# Patient Record
Sex: Male | Born: 1965 | Race: White | Hispanic: No | Smoking: Former smoker
Health system: Southern US, Community
[De-identification: ages and names within clinical notes are randomized; demographics above are authoritative.]

## PROBLEM LIST (undated history)

## (undated) DIAGNOSIS — I509 Heart failure, unspecified: Secondary | ICD-10-CM

## (undated) DIAGNOSIS — F319 Bipolar disorder, unspecified: Secondary | ICD-10-CM

## (undated) DIAGNOSIS — E119 Type 2 diabetes mellitus without complications: Secondary | ICD-10-CM

## (undated) DIAGNOSIS — I10 Essential (primary) hypertension: Secondary | ICD-10-CM

---

## 2008-02-15 ENCOUNTER — Ambulatory Visit: Payer: Self-pay | Admitting: Diagnostic Radiology

## 2008-02-15 ENCOUNTER — Emergency Department (HOSPITAL_BASED_OUTPATIENT_CLINIC_OR_DEPARTMENT_OTHER): Admission: EM | Admit: 2008-02-15 | Discharge: 2008-02-15 | Payer: Self-pay | Admitting: Emergency Medicine

## 2010-12-17 LAB — POCT CARDIAC MARKERS: CKMB, poc: <1 ng/mL — ABNORMAL LOW (ref 1.0–8.0)

## 2010-12-17 LAB — COMPREHENSIVE METABOLIC PANEL
BUN: 15 mg/dL (ref 6–23)
CO2: 28 mEq/L (ref 19–32)
Calcium: 10.4 mg/dL (ref 8.4–10.5)
Chloride: 100 mEq/L (ref 96–112)
Creatinine, Ser: 1 mg/dL (ref 0.4–1.5)
GFR calc non Af Amer: >60 mL/min (ref >60)
Total Bilirubin: 0.5 mg/dL (ref 0.3–1.2)

## 2010-12-17 LAB — PROTIME-INR
INR: 1 (ref 0.00–1.49)
Prothrombin Time: 13 seconds (ref 11.6–15.2)

## 2010-12-17 LAB — DIFFERENTIAL
Basophils Absolute: 0.1 10*3/uL (ref 0.0–0.1)
Eosinophils Relative: 2 % (ref 0–5)
Lymphocytes Relative: 35 % (ref 12–46)
Monocytes Relative: 8 % (ref 3–12)
Neutrophils Relative %: 54 % (ref 43–77)

## 2010-12-17 LAB — CBC
HCT: 37.8 % — ABNORMAL LOW (ref 39.0–52.0)
MCHC: 31.1 g/dL (ref 30.0–36.0)
MCV: 68.8 fL — ABNORMAL LOW (ref 78.0–100.0)
RBC: 5.5 MIL/uL (ref 4.22–5.81)
WBC: 8.2 10*3/uL (ref 4.0–10.5)

## 2010-12-17 LAB — LIPASE, BLOOD: Lipase: 47 U/L (ref 23–300)

## 2014-12-11 ENCOUNTER — Emergency Department (HOSPITAL_BASED_OUTPATIENT_CLINIC_OR_DEPARTMENT_OTHER): Payer: Medicaid Other

## 2014-12-11 ENCOUNTER — Encounter (HOSPITAL_BASED_OUTPATIENT_CLINIC_OR_DEPARTMENT_OTHER): Payer: Self-pay | Admitting: Emergency Medicine

## 2014-12-11 ENCOUNTER — Inpatient Hospital Stay (HOSPITAL_BASED_OUTPATIENT_CLINIC_OR_DEPARTMENT_OTHER)
Admission: EM | Admit: 2014-12-11 | Discharge: 2014-12-31 | DRG: 871 | Disposition: A | Discharge status: Hospice | Payer: Medicaid Other | Attending: Internal Medicine | Admitting: Internal Medicine

## 2014-12-11 DIAGNOSIS — F319 Bipolar disorder, unspecified: Secondary | ICD-10-CM | POA: Diagnosis present

## 2014-12-11 DIAGNOSIS — Z79899 Other long term (current) drug therapy: Secondary | ICD-10-CM

## 2014-12-11 DIAGNOSIS — Z7982 Long term (current) use of aspirin: Secondary | ICD-10-CM

## 2014-12-11 DIAGNOSIS — Z9114 Patient's other noncompliance with medication regimen: Secondary | ICD-10-CM

## 2014-12-11 DIAGNOSIS — E871 Hypo-osmolality and hyponatremia: Secondary | ICD-10-CM | POA: Diagnosis present

## 2014-12-11 DIAGNOSIS — Z794 Long term (current) use of insulin: Secondary | ICD-10-CM

## 2014-12-11 DIAGNOSIS — L03314 Cellulitis of groin: Secondary | ICD-10-CM | POA: Diagnosis present

## 2014-12-11 DIAGNOSIS — T368X5A Adverse effect of other systemic antibiotics, initial encounter: Secondary | ICD-10-CM | POA: Diagnosis not present

## 2014-12-11 DIAGNOSIS — Z6841 Body Mass Index (BMI) 40.0 and over, adult: Secondary | ICD-10-CM

## 2014-12-11 DIAGNOSIS — R4182 Altered mental status, unspecified: Secondary | ICD-10-CM

## 2014-12-11 DIAGNOSIS — I272 Other secondary pulmonary hypertension: Secondary | ICD-10-CM | POA: Diagnosis present

## 2014-12-11 DIAGNOSIS — L03116 Cellulitis of left lower limb: Secondary | ICD-10-CM | POA: Diagnosis present

## 2014-12-11 DIAGNOSIS — Z515 Encounter for palliative care: Secondary | ICD-10-CM

## 2014-12-11 DIAGNOSIS — E0781 Sick-euthyroid syndrome: Secondary | ICD-10-CM | POA: Diagnosis present

## 2014-12-11 DIAGNOSIS — N183 Chronic kidney disease, stage 3 unspecified: Secondary | ICD-10-CM | POA: Diagnosis present

## 2014-12-11 DIAGNOSIS — E860 Dehydration: Secondary | ICD-10-CM | POA: Diagnosis present

## 2014-12-11 DIAGNOSIS — N5089 Other specified disorders of the male genital organs: Secondary | ICD-10-CM

## 2014-12-11 DIAGNOSIS — A419 Sepsis, unspecified organism: Principal | ICD-10-CM | POA: Diagnosis present

## 2014-12-11 DIAGNOSIS — I361 Nonrheumatic tricuspid (valve) insufficiency: Secondary | ICD-10-CM | POA: Diagnosis present

## 2014-12-11 DIAGNOSIS — I426 Alcoholic cardiomyopathy: Secondary | ICD-10-CM | POA: Diagnosis present

## 2014-12-11 DIAGNOSIS — I739 Peripheral vascular disease, unspecified: Secondary | ICD-10-CM | POA: Diagnosis present

## 2014-12-11 DIAGNOSIS — L97929 Non-pressure chronic ulcer of unspecified part of left lower leg with unspecified severity: Secondary | ICD-10-CM | POA: Diagnosis present

## 2014-12-11 DIAGNOSIS — K219 Gastro-esophageal reflux disease without esophagitis: Secondary | ICD-10-CM | POA: Diagnosis present

## 2014-12-11 DIAGNOSIS — L0390 Cellulitis, unspecified: Secondary | ICD-10-CM | POA: Diagnosis present

## 2014-12-11 DIAGNOSIS — I130 Hypertensive heart and chronic kidney disease with heart failure and stage 1 through stage 4 chronic kidney disease, or unspecified chronic kidney disease: Secondary | ICD-10-CM | POA: Diagnosis present

## 2014-12-11 DIAGNOSIS — J9601 Acute respiratory failure with hypoxia: Secondary | ICD-10-CM | POA: Diagnosis not present

## 2014-12-11 DIAGNOSIS — R21 Rash and other nonspecific skin eruption: Secondary | ICD-10-CM | POA: Diagnosis not present

## 2014-12-11 DIAGNOSIS — F419 Anxiety disorder, unspecified: Secondary | ICD-10-CM | POA: Diagnosis present

## 2014-12-11 DIAGNOSIS — I5023 Acute on chronic systolic (congestive) heart failure: Secondary | ICD-10-CM | POA: Diagnosis present

## 2014-12-11 DIAGNOSIS — L97919 Non-pressure chronic ulcer of unspecified part of right lower leg with unspecified severity: Secondary | ICD-10-CM | POA: Diagnosis present

## 2014-12-11 DIAGNOSIS — I959 Hypotension, unspecified: Secondary | ICD-10-CM | POA: Diagnosis present

## 2014-12-11 DIAGNOSIS — Z9119 Patient's noncompliance with other medical treatment and regimen: Secondary | ICD-10-CM

## 2014-12-11 DIAGNOSIS — R601 Generalized edema: Secondary | ICD-10-CM | POA: Diagnosis present

## 2014-12-11 DIAGNOSIS — R451 Restlessness and agitation: Secondary | ICD-10-CM | POA: Diagnosis not present

## 2014-12-11 DIAGNOSIS — L03115 Cellulitis of right lower limb: Secondary | ICD-10-CM | POA: Diagnosis present

## 2014-12-11 DIAGNOSIS — G4733 Obstructive sleep apnea (adult) (pediatric): Secondary | ICD-10-CM | POA: Diagnosis present

## 2014-12-11 DIAGNOSIS — N179 Acute kidney failure, unspecified: Secondary | ICD-10-CM | POA: Diagnosis present

## 2014-12-11 DIAGNOSIS — R0602 Shortness of breath: Secondary | ICD-10-CM

## 2014-12-11 DIAGNOSIS — G9340 Encephalopathy, unspecified: Secondary | ICD-10-CM | POA: Diagnosis not present

## 2014-12-11 DIAGNOSIS — IMO0001 Reserved for inherently not codable concepts without codable children: Secondary | ICD-10-CM | POA: Insufficient documentation

## 2014-12-11 DIAGNOSIS — E876 Hypokalemia: Secondary | ICD-10-CM | POA: Diagnosis present

## 2014-12-11 DIAGNOSIS — B369 Superficial mycosis, unspecified: Secondary | ICD-10-CM | POA: Diagnosis present

## 2014-12-11 DIAGNOSIS — Z66 Do not resuscitate: Secondary | ICD-10-CM | POA: Diagnosis not present

## 2014-12-11 DIAGNOSIS — Z590 Homelessness: Secondary | ICD-10-CM

## 2014-12-11 DIAGNOSIS — N492 Inflammatory disorders of scrotum: Secondary | ICD-10-CM | POA: Diagnosis present

## 2014-12-11 DIAGNOSIS — Z880 Allergy status to penicillin: Secondary | ICD-10-CM

## 2014-12-11 DIAGNOSIS — E1122 Type 2 diabetes mellitus with diabetic chronic kidney disease: Secondary | ICD-10-CM | POA: Diagnosis present

## 2014-12-11 DIAGNOSIS — Z87891 Personal history of nicotine dependence: Secondary | ICD-10-CM

## 2014-12-11 DIAGNOSIS — L03119 Cellulitis of unspecified part of limb: Secondary | ICD-10-CM

## 2014-12-11 DIAGNOSIS — I340 Nonrheumatic mitral (valve) insufficiency: Secondary | ICD-10-CM | POA: Diagnosis present

## 2014-12-11 HISTORY — DX: Type 2 diabetes mellitus without complications: E11.9

## 2014-12-11 HISTORY — DX: Bipolar disorder, unspecified: F31.9

## 2014-12-11 HISTORY — DX: Essential (primary) hypertension: I10

## 2014-12-11 HISTORY — DX: Heart failure, unspecified: I50.9

## 2014-12-11 LAB — COMPREHENSIVE METABOLIC PANEL
ALK PHOS: 205 U/L — AB (ref 38–126)
ALT: 14 U/L — ABNORMAL LOW (ref 17–63)
ANION GAP: 14 (ref 5–15)
AST: 35 U/L (ref 15–41)
Albumin: 2.2 g/dL — ABNORMAL LOW (ref 3.5–5.0)
BILIRUBIN TOTAL: 4.3 mg/dL — AB (ref 0.3–1.2)
BUN: 35 mg/dL — ABNORMAL HIGH (ref 6–20)
CALCIUM: 8.5 mg/dL — AB (ref 8.9–10.3)
CO2: 26 mmol/L (ref 22–32)
Chloride: 89 mmol/L — ABNORMAL LOW (ref 101–111)
Creatinine, Ser: 2.32 mg/dL — ABNORMAL HIGH (ref 0.61–1.24)
GFR calc non Af Amer: 31 mL/min — ABNORMAL LOW (ref >60)
GFR, EST AFRICAN AMERICAN: 36 mL/min — AB (ref >60)
Glucose, Bld: 193 mg/dL — ABNORMAL HIGH (ref 65–99)
POTASSIUM: 3.5 mmol/L (ref 3.5–5.1)
SODIUM: 129 mmol/L — AB (ref 135–145)
TOTAL PROTEIN: 7 g/dL (ref 6.5–8.1)

## 2014-12-11 LAB — I-STAT CG4 LACTIC ACID, ED
LACTIC ACID, VENOUS: 3.62 mmol/L — AB (ref 0.5–2.0)
Lactic Acid, Venous: 3.83 mmol/L (ref 0.5–2.0)

## 2014-12-11 LAB — CBC WITH DIFFERENTIAL/PLATELET
Basophils Absolute: 0 10*3/uL (ref 0.0–0.1)
Basophils Relative: 0 %
Eosinophils Absolute: 0 10*3/uL (ref 0.0–0.7)
Eosinophils Relative: 0 %
HCT: 35.5 % — ABNORMAL LOW (ref 39.0–52.0)
Hemoglobin: 10.3 g/dL — ABNORMAL LOW (ref 13.0–17.0)
Lymphocytes Relative: 7 %
Lymphs Abs: 0.9 10*3/uL (ref 0.7–4.0)
MCH: 18.6 pg — ABNORMAL LOW (ref 26.0–34.0)
MCHC: 29 g/dL — ABNORMAL LOW (ref 30.0–36.0)
MCV: 64.1 fL — ABNORMAL LOW (ref 78.0–100.0)
Monocytes Absolute: 0.5 10*3/uL (ref 0.1–1.0)
Monocytes Relative: 4 %
Neutro Abs: 11.1 10*3/uL — ABNORMAL HIGH (ref 1.7–7.7)
Neutrophils Relative %: 89 %
Platelets: 396 10*3/uL (ref 150–400)
RBC: 5.54 MIL/uL (ref 4.22–5.81)
RDW: 22 % — ABNORMAL HIGH (ref 11.5–15.5)
WBC: 12.5 10*3/uL — ABNORMAL HIGH (ref 4.0–10.5)

## 2014-12-11 LAB — TROPONIN I: Troponin I: 0.04 ng/mL — ABNORMAL HIGH (ref <0.031)

## 2014-12-11 LAB — BRAIN NATRIURETIC PEPTIDE: B Natriuretic Peptide: 1897.6 pg/mL — ABNORMAL HIGH (ref 0.0–100.0)

## 2014-12-11 MED ORDER — SODIUM CHLORIDE 0.9 % IV BOLUS (SEPSIS)
500.0000 mL | Freq: Once | INTRAVENOUS | Status: AC
  Administered 2014-12-11: 500 mL via INTRAVENOUS

## 2014-12-11 MED ORDER — SODIUM CHLORIDE 0.9 % IV BOLUS (SEPSIS)
1000.0000 mL | Freq: Once | INTRAVENOUS | Status: AC
  Administered 2014-12-11: 1000 mL via INTRAVENOUS

## 2014-12-11 MED ORDER — FUROSEMIDE 10 MG/ML IJ SOLN
80.0000 mg | Freq: Once | INTRAMUSCULAR | Status: AC
  Administered 2014-12-11: 80 mg via INTRAVENOUS
  Filled 2014-12-11: qty 8

## 2014-12-11 MED ORDER — VANCOMYCIN HCL IN DEXTROSE 1-5 GM/200ML-% IV SOLN
1000.0000 mg | Freq: Once | INTRAVENOUS | Status: AC
  Administered 2014-12-11: 1000 mg via INTRAVENOUS
  Filled 2014-12-11: qty 200

## 2014-12-11 MED ORDER — CIPROFLOXACIN IN D5W 400 MG/200ML IV SOLN
400.0000 mg | Freq: Once | INTRAVENOUS | Status: AC
  Administered 2014-12-11: 400 mg via INTRAVENOUS
  Filled 2014-12-11: qty 200

## 2014-12-11 MED ORDER — MORPHINE SULFATE (PF) 4 MG/ML IV SOLN
4.0000 mg | Freq: Once | INTRAVENOUS | Status: AC
  Administered 2014-12-11: 4 mg via INTRAVENOUS

## 2014-12-11 MED ORDER — MORPHINE SULFATE (PF) 4 MG/ML IV SOLN
4.0000 mg | Freq: Once | INTRAVENOUS | Status: AC
  Administered 2014-12-11: 4 mg via INTRAVENOUS
  Filled 2014-12-11: qty 1

## 2014-12-11 MED ORDER — CLINDAMYCIN PHOSPHATE 600 MG/50ML IV SOLN
600.0000 mg | Freq: Once | INTRAVENOUS | Status: AC
  Administered 2014-12-11: 600 mg via INTRAVENOUS
  Filled 2014-12-11: qty 50

## 2014-12-11 MED ORDER — MORPHINE SULFATE (PF) 4 MG/ML IV SOLN
INTRAVENOUS | Status: AC
Start: 2014-12-11 — End: 2014-12-12
  Filled 2014-12-11: qty 1

## 2014-12-11 MED ORDER — DIPHENHYDRAMINE HCL 50 MG/ML IJ SOLN
25.0000 mg | Freq: Once | INTRAMUSCULAR | Status: AC
  Administered 2014-12-11: 25 mg via INTRAVENOUS
  Filled 2014-12-11: qty 1

## 2014-12-11 NOTE — ED Provider Notes (Signed)
CSN: [id number]     Arrival date & time 12/11/14  1747 History  By signing my name below, I, Gwenyth Ober, attest that this documentation has been prepared under the direction and in the presence of Rolan Bucco, MD  Electronically Signed: Gwenyth Ober, ED Scribe. 12/11/2014. 7:09 PM.   Chief Complaint  Patient presents with  . Chest Pain   The history is provided by the patient and a relative. No language interpreter was used.    HPI Comments: Tom Reynolds is a 49 y.o. male accompanied by family, with a history of substance abuse, DM, HTN, CHF and renal failure, who presents to the Emergency Department complaining of constant, moderate SOB that started 2 weeks ago and became worse today. Pt states increased bilateral leg swelling, myalgias, subjective fever, productive cough with clear sputum and genital pain and swelling as associated symptoms. He also notes CP that started today and becomes worse with deep breathing. Pt was seen in the ED at [facility] 2 days ago and left AMA . He was hospitalized at [facility] for CHF 2 weeks ago and was discharged 1 week ago. 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 He takes Lasix-120 mg daily and has been compliant with his medication. Pt denies abdominal pain.  He was also seen at a clinic in [facility] this morning where he also left AMA according to their notes.  On review of his last hospital admission earlier this month, he had an echo showing an EF of 20-25%  No PCP  Past Medical History  Diagnosis Date  . Diabetes mellitus without complication   . Hypertension   . CHF (congestive heart failure)    History reviewed. No pertinent past surgical history. History reviewed. No pertinent family history. Social History  Substance Use Topics  . Smoking status: Former Games developer  . Smokeless tobacco: None  . Alcohol Use: No   Review of Systems  Constitutional: Positive for fever (subjective). Negative for chills,  diaphoresis and fatigue.  HENT: Negative for congestion, rhinorrhea and sneezing.   Eyes: Negative.   Respiratory: Positive for cough and shortness of breath. Negative for chest tightness.   Cardiovascular: Positive for leg swelling. Negative for chest pain.  Gastrointestinal: Negative for nausea, vomiting, abdominal pain, diarrhea and blood in stool.  Genitourinary: Positive for penile swelling and scrotal swelling. Negative for frequency, hematuria, flank pain and difficulty urinating.  Musculoskeletal: Positive for myalgias. Negative for back pain and arthralgias.  Skin: Negative for rash.  Neurological: Negative for dizziness, speech difficulty, weakness, numbness and headaches.  All other systems reviewed and are negative.  Allergies  Penicillins  Home Medications   Prior to Admission medications   Medication Sig Start Date End Date Taking? Authorizing Provider  aspirin 81 MG tablet Take 81 mg by mouth daily.   Yes Historical Provider, MD  omeprazole (PRILOSEC) 40 MG capsule Take 40 mg by mouth daily.   Yes Historical Provider, MD   BP 104/82 mmHg  Pulse 116  Temp(Src) 97.7 F (36.5 C) (Oral)  Resp 22  Ht  (1.702 m)  Wt 276 lb 3 oz (125.278 kg)  BMI 43.25 kg/m2  SpO2 100% Physical Exam  Constitutional: He is oriented to person, place, and time. He appears well-developed and well-nourished.  Obese  HENT:  Head: Normocephalic and atraumatic.  Eyes: Pupils are equal, round, and reactive to light.  Neck: Normal range of motion. Neck supple.  Cardiovascular: Normal rate,  regular rhythm and normal heart sounds.   Pulmonary/Chest: Effort normal and breath sounds normal. No respiratory distress. He has no wheezes. He has no rales. He exhibits no tenderness.  Abdominal: Soft. Bowel sounds are normal. There is no tenderness. There is no rebound and no guarding.  Genitourinary:  Patient has diffuse swelling to his scrotum with erythema around his scrotum and lower abdomen.  There is a large area of the anterior portion of the scrotum he has some skin sloughing/exudative material  Musculoskeletal: Normal range of motion. He exhibits edema.  Diffuse edema to both lower extremities with erythema throughout his lower extremities. He has weeping of fluid from his legs. He also has several draining ulcerated areas to his right lower leg  Lymphadenopathy:    He has no cervical adenopathy.  Neurological: He is alert and oriented to person, place, and time.  Skin: Skin is warm and dry. No rash noted.  Psychiatric: He has a normal mood and affect.        ED Course  Procedures   DIAGNOSTIC STUDIES: Oxygen Saturation is 100% on RA, normal by my interpretation.    COORDINATION OF CARE: 6:49 PM Discussed treatment plan which includes a chest x-ray and lab work with pt and family at bedside. They agreed to plan.  Labs Review Labs Reviewed  COMPREHENSIVE METABOLIC PANEL - Abnormal; Notable for the following:    Sodium 129 (*)    Chloride 89 (*)    Glucose, Bld 193 (*)    BUN 35 (*)    Creatinine, Ser 2.32 (*)    Calcium 8.5 (*)    Albumin 2.2 (*)    ALT 14 (*)    Alkaline Phosphatase 205 (*)    Total Bilirubin 4.3 (*)    GFR calc non Af Amer 31 (*)    GFR calc Af Amer 36 (*)    All other components within normal limits  CBC WITH DIFFERENTIAL/PLATELET - Abnormal; Notable for the following:    WBC 12.5 (*)    Hemoglobin 10.3 (*)    HCT 35.5 (*)    MCV 64.1 (*)    MCH 18.6 (*)    MCHC 29.0 (*)    RDW 22.0 (*)    Neutro Abs 11.1 (*)    All other components within normal limits  TROPONIN I - Abnormal; Notable for the following:    Troponin I 0.04 (*)    All other components within normal limits  BRAIN NATRIURETIC PEPTIDE - Abnormal; Notable for the following:    B Natriuretic Peptide 1897.6 (*)    All other components within normal limits  I-STAT CG4 LACTIC ACID, ED - Abnormal; Notable for the following:    Lactic Acid, Venous 3.83 (*)    All other  components within normal limits  I-STAT CG4 LACTIC ACID, ED - Abnormal; Notable for the following:    Lactic Acid, Venous 3.62 (*)    All other components within normal limits  CULTURE, BLOOD (ROUTINE X 2)  CULTURE, BLOOD (ROUTINE X 2)  URINE CULTURE  URINALYSIS, ROUTINE W REFLEX MICROSCOPIC (NOT AT [facility])  I-STAT CG4 LACTIC ACID, ED    Imaging Review Dg Chest 2 View  12/11/2014   CLINICAL DATA:  Shortness of breath and scrotal swelling  EXAM: CHEST  2 VIEW  COMPARISON:  December 09, 2014  FINDINGS: The heart size is enlarged. The mediastinal contour is normal. There is no focal infiltrate, pulmonary edema, or pleural effusion. Degenerative joint changes of the spine  are noted.  IMPRESSION: No active cardiopulmonary disease.  Cardiomegaly.   Electronically Signed   By: Sherian Rein M.D.   On: 12/11/2014 21:33   US Scrotum  12/11/2014   CLINICAL DATA:  Scrotal swelling for several weeks  EXAM: SCROTAL ULTRASOUND  DOPPLER ULTRASOUND OF THE TESTICLES  TECHNIQUE: Complete ultrasound examination of the testicles, epididymis, and other scrotal structures was performed. Color and spectral Doppler ultrasound were also utilized to evaluate blood flow to the testicles.  COMPARISON:  11/26/2014  FINDINGS: Right testicle  Measurements: 41 x 28 x 29 mm. No mass or microlithiasis visualized.  Left testicle  Measurements: 38 x 28 x 28 mm. No mass or microlithiasis visualized.  Right epididymis: Not able to be visualized. No indication of enlargement or hypervascularity.  Left epididymis: Not able to be visualized. No indication of enlargement or hypervascularity.  Hydrocele:  None visualized.  Varicocele:  None visualized.  Pulsed Doppler interrogation of both testes demonstrates normal low resistance arterial waveforms bilaterally. Detection and venous waveforms was more difficult, likely hindered by skin thickening.  Improved but still marked thickening of the scrotal wall diffusely, without evidence of  collection or gas.  IMPRESSION: 1. Scrotal wall thickening, improved from 11/26/2014 but still marked. No fluid collection. 2. Negative testicles.   Electronically Signed   By: Marnee Spring M.D.   On: 12/11/2014 22:00   [place] Art/ven Flow Abd Pelv Doppler  12/11/2014   CLINICAL DATA:  Scrotal swelling for several weeks  EXAM: SCROTAL ULTRASOUND  DOPPLER ULTRASOUND OF THE TESTICLES  TECHNIQUE: Complete ultrasound examination of the testicles, epididymis, and other scrotal structures was performed. Color and spectral Doppler ultrasound were also utilized to evaluate blood flow to the testicles.  COMPARISON:  11/26/2014  FINDINGS: Right testicle  Measurements: 41 x 28 x 29 mm. No mass or microlithiasis visualized.  Left testicle  Measurements: 38 x 28 x 28 mm. No mass or microlithiasis visualized.  Right epididymis: Not able to be visualized. No indication of enlargement or hypervascularity.  Left epididymis: Not able to be visualized. No indication of enlargement or hypervascularity.  Hydrocele:  None visualized.  Varicocele:  None visualized.  Pulsed Doppler interrogation of both testes demonstrates normal low resistance arterial waveforms bilaterally. Detection and venous waveforms was more difficult, likely hindered by skin thickening.  Improved but still marked thickening of the scrotal wall diffusely, without evidence of collection or gas.  IMPRESSION: 1. Scrotal wall thickening, improved from 11/26/2014 but still marked. No fluid collection. 2. Negative testicles.   Electronically Signed   By: Marnee Spring M.D.   On: 12/11/2014 22:00   I have personally reviewed and evaluated these images and lab results as part of my medical decision-making.   EKG Interpretation   Date/Time:  Thursday December 11 2014 18:00:43 EDT Ventricular Rate:  110 PR Interval:  158 QRS Duration: 94 QT Interval:  376 QTC Calculation: 508 R Axis:   28 Text Interpretation:  Sinus tachycardia Possible Left atrial  enlargement  Nonspecific T wave abnormality Abnormal ECG changed from prior EKG, more T  wave flattening Confirmed by BELFI  MD, MELANIE ([id number]) on 12/11/2014  8:30:40 PM     MDM   Final diagnoses:  Cellulitis of lower extremity, unspecified laterality  Anasarca  Sepsis, due to unspecified organism    Patient initially presents with signs of CHF. He also has evidence of possible diffuse cellulitis to his lower extremities and diffuse swelling with questionable cellulitis/Fournier's gangrene to his scrotum. Per his report,  his scrotum has looked like this for about 6-7 days. He does have a penicillin allergy and in consultation with the pharmacists, he was started on vancomycin, clindamycin and Cipro.  20:15 patient does have positive sepsis markers with an elevated WBC count and lactate. He's mildly tachycardic. He has now has not been hypotensive. I did consult again with the pharmacists who feels that the antibiotics we have used our appropriate coverage at this point. His weight-based fluid bolus would be 4 L. I feel that this patient is markedly fluid overloaded currently. I will give fluid more slowly and cautiously.  22:30 no evidence of gas in tissues of scrotum that would be more indicative of Fournier's gangrene.  He also states that the area of skin breakdown has been there for about 6-7 days.   He currently has received about 700 cc of IV fluids and has a second liter ordered to be given over 2 hours. His repeat lactate is similar to his first lactate but slightly improved.  23:45 I spoke with Dr. Allena Katz who requested I speak with urology prior to transfer. I spoke with Dr. Chilton Si to see urology fellow on. He feels that we should go ahead and do a CT scan prior to transfer. If his CT scan does not show evidence of Fournier's gangrene, he can be admitted to [facility] for Dr. Allena Katz and urology will consult on the patient. Dr. Daun Peacock to follow-up on CT scan andwith Dr. Allena Katz following the  CT scan. Hasn't now, patient has had about 1.5 L of fluid. His respirations are okay with no increased work of breathing. Oxygen saturations are normal. He is not hypotensive.  Sepsis - Repeat Assessment  Performed at:    22:00   Vitals     Blood pressure 104/82, pulse 116, temperature 97.7 F (36.5 C), temperature source Oral, resp. rate 22, height  (1.702 m), weight 276 lb 3 oz (125.278 kg), SpO2 100 %.  Heart:     Tachycardic  Lungs:    CTA  Capillary Refill:   <2 sec  Peripheral Pulse:   Radial pulse palpable  Skin:     Normal Color    CRITICAL CARE Performed by: BELFI, MELANIE Total critical care time: 75 Critical care time was exclusive of separately billable procedures and treating other patients. Critical care was necessary to treat or prevent imminent or life-threatening deterioration. Critical care was time spent personally by me on the following activities: development of treatment plan with patient and/or surrogate as well as nursing, discussions with consultants, evaluation of patient's response to treatment, examination of patient, obtaining history from patient or surrogate, ordering and performing treatments and interventions, ordering and review of laboratory studies, ordering and review of radiographic studies, pulse oximetry and re-evaluation of patient's condition.   I personally performed the services described in this documentation, which was scribed in my presence.  The recorded information has been reviewed and considered.     Rolan Bucco, MD 12/12/14 [id number]

## 2014-12-11 NOTE — ED Notes (Signed)
Pt states his scrotum "is huge and I am proud of it". Asks Nurse "do you want to see it". Pt instructed to wait for MD.

## 2014-12-11 NOTE — ED Notes (Signed)
C/o all over generalized pain. Increased sob. Pt very unkept.

## 2014-12-11 NOTE — ED Notes (Signed)
Pt with significant medical history, was at [facility] today and left [facility], pt stated that they overmedicated him and he wanted to go to another hospital

## 2014-12-11 NOTE — ED Notes (Signed)
Called to room pt c/o choking and can't breathe. PT sats 100%. Pt hyperventilating. Pt encouraged to slow breathing down and breathe in his nose and out his mouth. PT asked for emesis basin. Pt spit up mod amt of white secretions into bag. Pt states he feels better. Pt sats remained 100%  thruout.

## 2014-12-12 ENCOUNTER — Emergency Department (HOSPITAL_BASED_OUTPATIENT_CLINIC_OR_DEPARTMENT_OTHER): Payer: Medicaid Other

## 2014-12-12 DIAGNOSIS — L03115 Cellulitis of right lower limb: Secondary | ICD-10-CM | POA: Diagnosis present

## 2014-12-12 DIAGNOSIS — J9601 Acute respiratory failure with hypoxia: Secondary | ICD-10-CM | POA: Diagnosis not present

## 2014-12-12 DIAGNOSIS — R601 Generalized edema: Secondary | ICD-10-CM | POA: Diagnosis present

## 2014-12-12 DIAGNOSIS — L03119 Cellulitis of unspecified part of limb: Secondary | ICD-10-CM | POA: Diagnosis not present

## 2014-12-12 DIAGNOSIS — L97919 Non-pressure chronic ulcer of unspecified part of right lower leg with unspecified severity: Secondary | ICD-10-CM | POA: Diagnosis present

## 2014-12-12 DIAGNOSIS — K219 Gastro-esophageal reflux disease without esophagitis: Secondary | ICD-10-CM | POA: Diagnosis present

## 2014-12-12 DIAGNOSIS — Z79899 Other long term (current) drug therapy: Secondary | ICD-10-CM | POA: Diagnosis not present

## 2014-12-12 DIAGNOSIS — Z9119 Patient's noncompliance with other medical treatment and regimen: Secondary | ICD-10-CM | POA: Diagnosis not present

## 2014-12-12 DIAGNOSIS — N508 Other specified disorders of male genital organs: Secondary | ICD-10-CM | POA: Diagnosis not present

## 2014-12-12 DIAGNOSIS — I959 Hypotension, unspecified: Secondary | ICD-10-CM | POA: Diagnosis present

## 2014-12-12 DIAGNOSIS — Z590 Homelessness: Secondary | ICD-10-CM | POA: Diagnosis not present

## 2014-12-12 DIAGNOSIS — IMO0001 Reserved for inherently not codable concepts without codable children: Secondary | ICD-10-CM | POA: Insufficient documentation

## 2014-12-12 DIAGNOSIS — I739 Peripheral vascular disease, unspecified: Secondary | ICD-10-CM | POA: Diagnosis present

## 2014-12-12 DIAGNOSIS — F3113 Bipolar disorder, current episode manic without psychotic features, severe: Secondary | ICD-10-CM | POA: Diagnosis not present

## 2014-12-12 DIAGNOSIS — R451 Restlessness and agitation: Secondary | ICD-10-CM | POA: Diagnosis not present

## 2014-12-12 DIAGNOSIS — N179 Acute kidney failure, unspecified: Secondary | ICD-10-CM | POA: Diagnosis present

## 2014-12-12 DIAGNOSIS — E871 Hypo-osmolality and hyponatremia: Secondary | ICD-10-CM | POA: Diagnosis present

## 2014-12-12 DIAGNOSIS — E0781 Sick-euthyroid syndrome: Secondary | ICD-10-CM | POA: Diagnosis present

## 2014-12-12 DIAGNOSIS — N183 Chronic kidney disease, stage 3 (moderate): Secondary | ICD-10-CM

## 2014-12-12 DIAGNOSIS — G9340 Encephalopathy, unspecified: Secondary | ICD-10-CM | POA: Diagnosis present

## 2014-12-12 DIAGNOSIS — Z9114 Patient's other noncompliance with medication regimen: Secondary | ICD-10-CM | POA: Diagnosis not present

## 2014-12-12 DIAGNOSIS — F319 Bipolar disorder, unspecified: Secondary | ICD-10-CM | POA: Diagnosis present

## 2014-12-12 DIAGNOSIS — I509 Heart failure, unspecified: Secondary | ICD-10-CM | POA: Diagnosis not present

## 2014-12-12 DIAGNOSIS — I5023 Acute on chronic systolic (congestive) heart failure: Secondary | ICD-10-CM | POA: Diagnosis present

## 2014-12-12 DIAGNOSIS — L0390 Cellulitis, unspecified: Secondary | ICD-10-CM | POA: Diagnosis present

## 2014-12-12 DIAGNOSIS — N5089 Other specified disorders of the male genital organs: Secondary | ICD-10-CM | POA: Diagnosis present

## 2014-12-12 DIAGNOSIS — N492 Inflammatory disorders of scrotum: Secondary | ICD-10-CM | POA: Diagnosis present

## 2014-12-12 DIAGNOSIS — F312 Bipolar disorder, current episode manic severe with psychotic features: Secondary | ICD-10-CM | POA: Diagnosis not present

## 2014-12-12 DIAGNOSIS — L03116 Cellulitis of left lower limb: Secondary | ICD-10-CM | POA: Diagnosis present

## 2014-12-12 DIAGNOSIS — L03314 Cellulitis of groin: Secondary | ICD-10-CM | POA: Diagnosis present

## 2014-12-12 DIAGNOSIS — G4733 Obstructive sleep apnea (adult) (pediatric): Secondary | ICD-10-CM | POA: Diagnosis present

## 2014-12-12 DIAGNOSIS — Z6841 Body Mass Index (BMI) 40.0 and over, adult: Secondary | ICD-10-CM | POA: Diagnosis not present

## 2014-12-12 DIAGNOSIS — Z794 Long term (current) use of insulin: Secondary | ICD-10-CM | POA: Diagnosis not present

## 2014-12-12 DIAGNOSIS — T368X5A Adverse effect of other systemic antibiotics, initial encounter: Secondary | ICD-10-CM | POA: Diagnosis not present

## 2014-12-12 DIAGNOSIS — F315 Bipolar disorder, current episode depressed, severe, with psychotic features: Secondary | ICD-10-CM | POA: Diagnosis not present

## 2014-12-12 DIAGNOSIS — R0602 Shortness of breath: Secondary | ICD-10-CM | POA: Diagnosis not present

## 2014-12-12 DIAGNOSIS — Z880 Allergy status to penicillin: Secondary | ICD-10-CM | POA: Diagnosis not present

## 2014-12-12 DIAGNOSIS — R410 Disorientation, unspecified: Secondary | ICD-10-CM | POA: Diagnosis not present

## 2014-12-12 DIAGNOSIS — I426 Alcoholic cardiomyopathy: Secondary | ICD-10-CM | POA: Diagnosis present

## 2014-12-12 DIAGNOSIS — A419 Sepsis, unspecified organism: Secondary | ICD-10-CM | POA: Diagnosis present

## 2014-12-12 DIAGNOSIS — I272 Other secondary pulmonary hypertension: Secondary | ICD-10-CM | POA: Diagnosis present

## 2014-12-12 DIAGNOSIS — L03818 Cellulitis of other sites: Secondary | ICD-10-CM | POA: Diagnosis not present

## 2014-12-12 DIAGNOSIS — Z66 Do not resuscitate: Secondary | ICD-10-CM | POA: Diagnosis not present

## 2014-12-12 DIAGNOSIS — B369 Superficial mycosis, unspecified: Secondary | ICD-10-CM | POA: Diagnosis present

## 2014-12-12 DIAGNOSIS — Z515 Encounter for palliative care: Secondary | ICD-10-CM | POA: Diagnosis not present

## 2014-12-12 DIAGNOSIS — R21 Rash and other nonspecific skin eruption: Secondary | ICD-10-CM | POA: Diagnosis not present

## 2014-12-12 DIAGNOSIS — I340 Nonrheumatic mitral (valve) insufficiency: Secondary | ICD-10-CM | POA: Diagnosis present

## 2014-12-12 DIAGNOSIS — L97929 Non-pressure chronic ulcer of unspecified part of left lower leg with unspecified severity: Secondary | ICD-10-CM | POA: Diagnosis present

## 2014-12-12 DIAGNOSIS — Z87891 Personal history of nicotine dependence: Secondary | ICD-10-CM | POA: Diagnosis not present

## 2014-12-12 DIAGNOSIS — I361 Nonrheumatic tricuspid (valve) insufficiency: Secondary | ICD-10-CM | POA: Diagnosis present

## 2014-12-12 DIAGNOSIS — I130 Hypertensive heart and chronic kidney disease with heart failure and stage 1 through stage 4 chronic kidney disease, or unspecified chronic kidney disease: Secondary | ICD-10-CM | POA: Diagnosis present

## 2014-12-12 DIAGNOSIS — F419 Anxiety disorder, unspecified: Secondary | ICD-10-CM | POA: Diagnosis present

## 2014-12-12 DIAGNOSIS — Z7982 Long term (current) use of aspirin: Secondary | ICD-10-CM | POA: Diagnosis not present

## 2014-12-12 DIAGNOSIS — E876 Hypokalemia: Secondary | ICD-10-CM | POA: Diagnosis present

## 2014-12-12 DIAGNOSIS — E1122 Type 2 diabetes mellitus with diabetic chronic kidney disease: Secondary | ICD-10-CM | POA: Diagnosis present

## 2014-12-12 DIAGNOSIS — E860 Dehydration: Secondary | ICD-10-CM | POA: Diagnosis present

## 2014-12-12 LAB — TROPONIN I
Troponin I: 0.08 ng/mL — ABNORMAL HIGH (ref <0.031)
Troponin I: 0.08 ng/mL — ABNORMAL HIGH (ref <0.031)

## 2014-12-12 LAB — URINE MICROSCOPIC-ADD ON

## 2014-12-12 LAB — URINALYSIS, ROUTINE W REFLEX MICROSCOPIC
GLUCOSE, UA: NEGATIVE mg/dL
Hgb urine dipstick: NEGATIVE
KETONES UR: 15 mg/dL — AB
NITRITE: POSITIVE — AB
PROTEIN: 100 mg/dL — AB
Specific Gravity, Urine: 1.02 (ref 1.005–1.030)
UROBILINOGEN UA: 2 mg/dL — AB (ref 0.0–1.0)
pH: 5 (ref 5.0–8.0)

## 2014-12-12 LAB — CBC
HCT: 36.7 % — ABNORMAL LOW (ref 39.0–52.0)
HEMOGLOBIN: 10.4 g/dL — AB (ref 13.0–17.0)
MCH: 19 pg — AB (ref 26.0–34.0)
MCHC: 28.3 g/dL — ABNORMAL LOW (ref 30.0–36.0)
MCV: 67.1 fL — ABNORMAL LOW (ref 78.0–100.0)
PLATELETS: 331 10*3/uL (ref 150–400)
RBC: 5.47 MIL/uL (ref 4.22–5.81)
RDW: 20.8 % — ABNORMAL HIGH (ref 11.5–15.5)
WBC: 16.7 10*3/uL — AB (ref 4.0–10.5)

## 2014-12-12 LAB — RAPID URINE DRUG SCREEN, HOSP PERFORMED
Amphetamines: NOT DETECTED
BENZODIAZEPINES: NOT DETECTED
Barbiturates: NOT DETECTED
COCAINE: POSITIVE — AB
OPIATES: POSITIVE — AB
TETRAHYDROCANNABINOL: NOT DETECTED

## 2014-12-12 LAB — COMPREHENSIVE METABOLIC PANEL
ALK PHOS: 196 U/L — AB (ref 38–126)
ALT: 14 U/L — AB (ref 17–63)
AST: 34 U/L (ref 15–41)
Albumin: 1.9 g/dL — ABNORMAL LOW (ref 3.5–5.0)
Anion gap: 15 (ref 5–15)
BUN: 37 mg/dL — ABNORMAL HIGH (ref 6–20)
CHLORIDE: 95 mmol/L — AB (ref 101–111)
CO2: 21 mmol/L — AB (ref 22–32)
CREATININE: 2.86 mg/dL — AB (ref 0.61–1.24)
Calcium: 8.4 mg/dL — ABNORMAL LOW (ref 8.9–10.3)
GFR calc Af Amer: 28 mL/min — ABNORMAL LOW (ref >60)
GFR calc non Af Amer: 24 mL/min — ABNORMAL LOW (ref >60)
GLUCOSE: 132 mg/dL — AB (ref 65–99)
Potassium: 4.1 mmol/L (ref 3.5–5.1)
SODIUM: 131 mmol/L — AB (ref 135–145)
Total Bilirubin: 4.1 mg/dL — ABNORMAL HIGH (ref 0.3–1.2)
Total Protein: 6.4 g/dL — ABNORMAL LOW (ref 6.5–8.1)

## 2014-12-12 LAB — GLUCOSE, CAPILLARY
GLUCOSE-CAPILLARY: 189 mg/dL — AB (ref 65–99)
GLUCOSE-CAPILLARY: 140 mg/dL — AB (ref 65–99)
Glucose-Capillary: 158 mg/dL — ABNORMAL HIGH (ref 65–99)

## 2014-12-12 LAB — MRSA PCR SCREENING: MRSA by PCR: NEGATIVE

## 2014-12-12 LAB — MAGNESIUM: Magnesium: 1.8 mg/dL (ref 1.7–2.4)

## 2014-12-12 LAB — TSH: TSH: 7.492 u[IU]/mL — AB (ref 0.350–4.500)

## 2014-12-12 MED ORDER — ACETAMINOPHEN 325 MG PO TABS: 650.0000 mg | ORAL_TABLET | Freq: Four times a day (QID) | ORAL | Status: DC | PRN

## 2014-12-12 MED ORDER — DEXTROSE 5 % IV SOLN
1.0000 g | Freq: Three times a day (TID) | INTRAVENOUS | Status: DC
  Administered 2014-12-12 – 2014-12-15 (×10): 1 g via INTRAVENOUS
  Filled 2014-12-12 (×14): qty 1

## 2014-12-12 MED ORDER — VANCOMYCIN HCL 10 G IV SOLR
1500.0000 mg | INTRAVENOUS | Status: DC
  Administered 2014-12-12 – 2014-12-15 (×4): 1500 mg via INTRAVENOUS
  Filled 2014-12-12 (×4): qty 1500

## 2014-12-12 MED ORDER — LORAZEPAM 2 MG/ML IJ SOLN
2.0000 mg | Freq: Once | INTRAMUSCULAR | Status: DC
  Filled 2014-12-12: qty 1

## 2014-12-12 MED ORDER — SODIUM CHLORIDE 0.9 % IV SOLN
INTRAVENOUS | Status: DC
  Administered 2014-12-12 (×2): via INTRAVENOUS
  Administered 2014-12-12: 75 mL/h via INTRAVENOUS
  Administered 2014-12-13 – 2014-12-16 (×4): via INTRAVENOUS
  Administered 2014-12-17: 1000 mL via INTRAVENOUS

## 2014-12-12 MED ORDER — INSULIN ASPART 100 UNIT/ML ~~LOC~~ SOLN
0.0000 [IU] | Freq: Three times a day (TID) | SUBCUTANEOUS | Status: DC
  Administered 2014-12-12 (×2): 2 [IU] via SUBCUTANEOUS
  Administered 2014-12-13 – 2014-12-14 (×2): 1 [IU] via SUBCUTANEOUS
  Administered 2014-12-14: 9 [IU] via SUBCUTANEOUS
  Administered 2014-12-14: 1 [IU] via SUBCUTANEOUS
  Administered 2014-12-15: 2 [IU] via SUBCUTANEOUS
  Administered 2014-12-15: 3 [IU] via SUBCUTANEOUS
  Administered 2014-12-15 – 2014-12-16 (×2): 1 [IU] via SUBCUTANEOUS
  Administered 2014-12-16 – 2014-12-17 (×3): 2 [IU] via SUBCUTANEOUS
  Administered 2014-12-18: 1 [IU] via SUBCUTANEOUS
  Administered 2014-12-18: 2 [IU] via SUBCUTANEOUS
  Administered 2014-12-18: 1 [IU] via SUBCUTANEOUS
  Administered 2014-12-19: 2 [IU] via SUBCUTANEOUS
  Administered 2014-12-19: 1 [IU] via SUBCUTANEOUS
  Administered 2014-12-19 – 2014-12-20 (×2): 2 [IU] via SUBCUTANEOUS
  Administered 2014-12-20: 1 [IU] via SUBCUTANEOUS
  Administered 2014-12-20: 3 [IU] via SUBCUTANEOUS
  Administered 2014-12-21 (×2): 2 [IU] via SUBCUTANEOUS
  Administered 2014-12-22 – 2014-12-24 (×5): 1 [IU] via SUBCUTANEOUS
  Administered 2014-12-24: 2 [IU] via SUBCUTANEOUS
  Administered 2014-12-24: 1 [IU] via SUBCUTANEOUS
  Administered 2014-12-25: 2 [IU] via SUBCUTANEOUS
  Administered 2014-12-25: 1 [IU] via SUBCUTANEOUS

## 2014-12-12 MED ORDER — SODIUM CHLORIDE 0.9 % IV BOLUS (SEPSIS)
500.0000 mL | Freq: Once | INTRAVENOUS | Status: AC
  Administered 2014-12-12: 500 mL via INTRAVENOUS

## 2014-12-12 MED ORDER — LEVALBUTEROL HCL 0.63 MG/3ML IN NEBU: 0.6300 mg | INHALATION_SOLUTION | Freq: Four times a day (QID) | RESPIRATORY_TRACT | Status: DC | PRN

## 2014-12-12 MED ORDER — COLLAGENASE 250 UNIT/GM EX OINT
TOPICAL_OINTMENT | Freq: Every day | CUTANEOUS | Status: DC
  Administered 2014-12-12 – 2014-12-13 (×2): via TOPICAL
  Administered 2014-12-14 – 2014-12-15 (×2): 1 via TOPICAL
  Administered 2014-12-16: 15:00:00 via TOPICAL
  Administered 2014-12-17 – 2014-12-18 (×2): 1 via TOPICAL
  Administered 2014-12-19 – 2014-12-27 (×9): via TOPICAL
  Administered 2014-12-28: 1 via TOPICAL
  Administered 2014-12-30 – 2014-12-31 (×2): via TOPICAL
  Filled 2014-12-12 (×2): qty 30

## 2014-12-12 MED ORDER — HYDROMORPHONE HCL 1 MG/ML IJ SOLN
0.5000 mg | INTRAMUSCULAR | Status: DC | PRN
  Administered 2014-12-12 – 2014-12-17 (×11): 0.5 mg via INTRAVENOUS
  Filled 2014-12-12 (×11): qty 1

## 2014-12-12 MED ORDER — MORPHINE SULFATE (PF) 4 MG/ML IV SOLN
4.0000 mg | Freq: Once | INTRAVENOUS | Status: AC
  Administered 2014-12-12: 4 mg via INTRAVENOUS
  Filled 2014-12-12: qty 1

## 2014-12-12 MED ORDER — DOCUSATE SODIUM 100 MG PO CAPS
100.0000 mg | ORAL_CAPSULE | Freq: Two times a day (BID) | ORAL | Status: DC
  Administered 2014-12-12 – 2014-12-25 (×23): 100 mg via ORAL
  Filled 2014-12-12 (×27): qty 1

## 2014-12-12 MED ORDER — HEPARIN SODIUM (PORCINE) 5000 UNIT/ML IJ SOLN: 5000.0000 [IU] | Freq: Three times a day (TID) | INTRAMUSCULAR | Status: DC

## 2014-12-12 MED ORDER — ACETAMINOPHEN 650 MG RE SUPP: 650.0000 mg | Freq: Four times a day (QID) | RECTAL | Status: DC | PRN

## 2014-12-12 MED ORDER — HEPARIN SODIUM (PORCINE) 5000 UNIT/ML IJ SOLN
5000.0000 [IU] | Freq: Three times a day (TID) | INTRAMUSCULAR | Status: DC
  Administered 2014-12-12 – 2014-12-28 (×50): 5000 [IU] via SUBCUTANEOUS
  Filled 2014-12-12 (×50): qty 1

## 2014-12-12 NOTE — Progress Notes (Signed)
ANTIBIOTIC CONSULT NOTE - INITIAL  Pharmacy Consult for Vancomycin/Aztreonam Indication: rule out sepsis  Allergies  Allergen Reactions  . Penicillins    Patient Measurements: Height:  (170.2 cm) Weight: 276 lb 7.3 oz (125.4 kg) IBW/kg (Calculated) : 66.1  Vital Signs: Temp: 97.2 F (36.2 C) (09/30 0532) Temp Source: Oral (09/30 0532) BP: 107/76 mmHg (09/30 0532) Pulse Rate: 114 (09/30 0532)  Labs:  Recent Labs  12/11/14 1845  WBC 12.5*  HGB 10.3*  PLT 396  CREATININE 2.32*   Estimated Creatinine Clearance: 47.8 mL/min (by C-G formula based on Cr of 2.32).  Microbiology: Recent Results (from the past 720 hour(s))  Culture, blood (routine x 2)     Status: None (Preliminary result)   Collection Time: 12/11/14  7:30 PM  Result Value Ref Range Status   Specimen Description   Final    BLOOD RIGHT ANTECUBITAL Performed at [facility]    Special Requests BOTTLES DRAWN AEROBIC AND ANAEROBIC EACH  Final   Culture PENDING  Incomplete   Report Status PENDING  Incomplete  Culture, blood (routine x 2)     Status: None (Preliminary result)   Collection Time: 12/11/14  8:08 PM  Result Value Ref Range Status   Specimen Description   Final    BLOOD LEFT ANTECUBITAL Performed at [facility]    Special Requests BOTTLES DRAWN AEROBIC AND ANAEROBIC EACH  Final   Culture PENDING  Incomplete   Report Status PENDING  Incomplete    Medical History: Past Medical History  Diagnosis Date  . Diabetes mellitus without complication   . Hypertension   . CHF (congestive heart failure)    Assessment: Bilateral cellulitis and r/o sepsis, also has scrotal wound, WBC mildly elevated, noted renal dysfunction, normalized CrCl is ~39, other labs as above.   Goal of Therapy:  Vancomycin trough level 15-20 mcg/ml  Plan:  -Vancomycin 1500 mg IV q24, will start now given lower dose load given 9/29 -Aztreonam 1g IV q8h -Trend WBC, temp, renal function  -Drug  levels as indicated   Abran Duke 12/12/2014,7:34 AM

## 2014-12-12 NOTE — Progress Notes (Signed)
Pt agitated and uncooperative, unwilling to comply with all treatment. Refusing heart monitor and IV fluids, medications. States he will leave the building and wants to leave AMA. Elray Mcgregor, NP notified and gave orders for  ativan, but pt is unwilling to accept any care at this time. Sister Darl Pikes called and all family is unwilling to bring clothes/pick him up at this time. Security called and pt is still unwilling to stay in the hospital. Will continue to monitor.

## 2014-12-12 NOTE — Progress Notes (Signed)
Patient ID: Tom Reynolds, male   DOB: 1965-04-21, 49 y.o.   MRN: [id number]  Was called to see patient about a foley earlier today but got delayed with emergencies at [facility].    A foley was placed by the nursing staff.   It is draining concentrated pink tinged urine.    The UA is nit + with 7-10 WBC.  He is on aztreonam and vanc.   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   He should continue with foley drainage until he is maximally diuresed and it would be worthwhile to consider a wound care consult for debridement of the eschar and dressing changes.

## 2014-12-12 NOTE — H&P (Addendum)
[facility] History and Physical  Tom Reynolds AVW:[id number] DOB: 11/09/1965 DOA: 12/11/2014  Referring physician:  PCP: No primary care provider on file.   Chief Complaint: chest pain   HPI:  49 y.o. male accompanied by family, with a history of substance abuse, DM, HTN, alcoholic cardiomyopathy/chronic systolic heart failure with an EF of 20-25% and chronic kidney disease stage II with baseline creatinine of 1.2, who presents at [facility] with worsening shortness of breath over the last 2 weeks and worsening bilateral lower extremity edema associated with subjective fever, productive cough, genital pain and swelling of the scrotum. Patient also complained of chest pain that started 2 days ago, described as pleuritic in nature. Patient presented with similar complaints with worsening swelling of the scrotum and redness with difficulty urinating to [facility] ED on 9/27. On 9/27 patient was found by EMS naked in the local  area and he was brought in but left AMA because of her court date. 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 Was found to have an elevated lactate, patient transferred to [facility] for further urology consult. CT scan done prior to transfer does not show evidence of Fournier's gangrene. Patient admitted to step down. Blood pressure soft 99 /63    Review of Systems: negative for the following  Eyes: Negative for discharge and visual disturbance.  Respiratory: Positive for chest tightness and shortness of breath. Negative for apnea and cough.  Cardiovascular: Positive for leg  swelling. Negative for chest pain and palpitations.  Gastrointestinal: Positive for abdominal pain and abdominal distention. Negative for nausea, vomiting and diarrhea.  Extensive scrotal swelling, lower abdominal swelling, redness to his entire lower abdomen and legs  Genitourinary: Negative for dysuria, frequency and hematuria.  Musculoskeletal: Negative for myalgias, back pain, arthralgias and neck pain.  Extensive lower extremity edema and redness to both of his legs  Skin: Negative for rash.  Neurological: Negative for dizziness, weakness, numbness and headaches.  Psychiatric/Behavioral: Negative for suicidal ideas and behavioral problems    Past Medical History  Diagnosis Date  . Diabetes mellitus without complication   . Hypertension   . CHF (congestive heart failure)      History reviewed. No pertinent past surgical history.    Social History:  reports that he has quit smoking. He does not have any smokeless tobacco history on file. He reports that he does not drink alcohol. His drug history is not on file.    Allergies  Allergen Reactions  . Penicillins         FAMILY HISTORY  When questioned  Directly-patient reports  No family history of HTN, CVA ,DIABETES, TB, Cancer CAD, Bleeding Disorders, Sickle Cell, diabetes, anemia, asthma,   Prior to Admission medications   Medication Sig Start Date End Date Taking? Authorizing Provider  aspirin 81 MG tablet Take 81 mg by mouth daily.   Yes Historical Provider, MD  omeprazole (PRILOSEC) 40 MG capsule Take 40 mg by mouth daily.   Yes Historical Provider, MD     Physical Exam: Filed Vitals:   12/12/14 0130 12/12/14 0424 12/12/14 0532 12/12/14 0812  BP: 164/102 96/68 107/76 99/63  Pulse: 117 108 114 109  Temp:   97.2 F (36.2 C) 97.6 F (36.4 C)  TempSrc:   Oral Oral  Resp: Height:    (1.702 m)   Weight:   125.4 kg (276 lb 7.3 oz)   SpO2: 100% 100% 100% 100%     Constitutional: Vital signs  reviewed. Patient is a well-developed and well-nourished in no acute distress and cooperative with exam. Alert and oriented x3.  Head: Normocephalic and atraumatic  Ear: TM normal bilaterally  Mouth: no erythema or exudates, MMM  Eyes: PERRL, EOMI, conjunctivae normal, No scleral icterus.  Neck: Supple, Trachea midline normal ROM, No JVD, mass, thyromegaly, or carotid bruit present.  Cardiovascular: RRR, S1 normal, S2 normal, no MRG, pulses symmetric and intact bilaterally  Pulmonary/Chest: CTAB, no wheezes, rales, or rhonchi  Abdominal: Soft. Non-tender, non-distended, bowel sounds are normal, no masses, organomegaly, or guarding present.  Genitourinary:  Patient has diffuse swelling to his scrotum with erythema around his scrotum and lower abdomen. There is a large area of the anterior portion of the scrotum he has some skin sloughing/exudative material  Musculoskeletal: Normal range of motion. He exhibits edema.  Diffuse edema to both lower extremities with erythema throughout his lower extremities. He has weeping of fluid from his legs. He also has several draining ulcerated areas to his right lower leg  Ext: no edema and no cyanosis, pulses palpable bilaterally (DP and PT)  Hematology: no cervical, inginal, or axillary adenopathy.  Neurological: A&O x3, Strenght is normal and symmetric bilaterally, cranial nerve II-XII are grossly intact, no focal motor deficit, sensory intact to light touch bilaterally.  Skin: Warm, dry and intact. No rash, cyanosis, or clubbing.  Psychiatric: Normal mood and affect. speech and behavior is normal. Judgment and thought content normal. Cognition and memory are normal.      Data Review   Micro Results Recent Results (from the past 240 hour(s))  Culture, blood (routine x 2)     Status: None (Preliminary result)   Collection Time: 12/11/14  7:30 PM  Result Value Ref Range Status   Specimen Description   Final    BLOOD RIGHT ANTECUBITAL Performed at  [facility]    Special Requests BOTTLES DRAWN AEROBIC AND ANAEROBIC EACH  Final   Culture PENDING  Incomplete   Report Status PENDING  Incomplete  Culture, blood (routine x 2)     Status: None (Preliminary result)   Collection Time: 12/11/14  8:08 PM  Result Value Ref Range Status   Specimen Description   Final    BLOOD LEFT ANTECUBITAL Performed at [facility]    Special Requests BOTTLES DRAWN AEROBIC AND ANAEROBIC EACH  Final   Culture PENDING  Incomplete   Report Status PENDING  Incomplete    Radiology Reports Dg Chest 2 View  12/11/2014   CLINICAL DATA:  Shortness of breath and scrotal swelling  EXAM: CHEST  2 VIEW  COMPARISON:  December 09, 2014  FINDINGS: The heart size is enlarged. The mediastinal contour is normal. There is no focal infiltrate, pulmonary edema, or pleural effusion. Degenerative joint changes of the spine are noted.  IMPRESSION: No active cardiopulmonary disease.  Cardiomegaly.   Electronically Signed   By: Sherian Rein M.D.   On: 12/11/2014 21:33   Ct Pelvis Wo Contrast  12/12/2014   CLINICAL DATA:  Sores and scrotal thickening. Evaluate for 48 gangrene.  EXAM: CT PELVIS WITHOUT CONTRAST  TECHNIQUE: Multidetector  CT imaging of the pelvis was performed following the standard protocol without intravenous contrast.  COMPARISON:  None.  FINDINGS: Diffuse, severe submucosal reticulation and skin thickening, including on the symptomatic scrotum. There is no soft tissue gas or evidence of fluid collection. No ulceration or bone infection.  Reactive appearing bilateral inguinal lymph node enlargement.  Small peritoneal fluid in the pelvis, likely from volume overload. Negative pelvic visceral.  Lower lumbar facet arthropathy.  IMPRESSION: Extensive anasarca, which could obscure superimposed cellulitis. No soft tissue gas or abscess.   Electronically Signed   By: Marnee Spring M.D.   On: 12/12/2014 00:48   US Scrotum  12/11/2014   CLINICAL DATA:   Scrotal swelling for several weeks  EXAM: SCROTAL ULTRASOUND  DOPPLER ULTRASOUND OF THE TESTICLES  TECHNIQUE: Complete ultrasound examination of the testicles, epididymis, and other scrotal structures was performed. Color and spectral Doppler ultrasound were also utilized to evaluate blood flow to the testicles.  COMPARISON:  11/26/2014  FINDINGS: Right testicle  Measurements: 41 x 28 x 29 mm. No mass or microlithiasis visualized.  Left testicle  Measurements: 38 x 28 x 28 mm. No mass or microlithiasis visualized.  Right epididymis: Not able to be visualized. No indication of enlargement or hypervascularity.  Left epididymis: Not able to be visualized. No indication of enlargement or hypervascularity.  Hydrocele:  None visualized.  Varicocele:  None visualized.  Pulsed Doppler interrogation of both testes demonstrates normal low resistance arterial waveforms bilaterally. Detection and venous waveforms was more difficult, likely hindered by skin thickening.  Improved but still marked thickening of the scrotal wall diffusely, without evidence of collection or gas.  IMPRESSION: 1. Scrotal wall thickening, improved from 11/26/2014 but still marked. No fluid collection. 2. Negative testicles.   Electronically Signed   By: Marnee Spring M.D.   On: 12/11/2014 22:00   [place] Art/ven Flow Abd Pelv Doppler  12/11/2014   CLINICAL DATA:  Scrotal swelling for several weeks  EXAM: SCROTAL ULTRASOUND  DOPPLER ULTRASOUND OF THE TESTICLES  TECHNIQUE: Complete ultrasound examination of the testicles, epididymis, and other scrotal structures was performed. Color and spectral Doppler ultrasound were also utilized to evaluate blood flow to the testicles.  COMPARISON:  11/26/2014  FINDINGS: Right testicle  Measurements: 41 x 28 x 29 mm. No mass or microlithiasis visualized.  Left testicle  Measurements: 38 x 28 x 28 mm. No mass or microlithiasis visualized.  Right epididymis: Not able to be visualized. No indication of enlargement or  hypervascularity.  Left epididymis: Not able to be visualized. No indication of enlargement or hypervascularity.  Hydrocele:  None visualized.  Varicocele:  None visualized.  Pulsed Doppler interrogation of both testes demonstrates normal low resistance arterial waveforms bilaterally. Detection and venous waveforms was more difficult, likely hindered by skin thickening.  Improved but still marked thickening of the scrotal wall diffusely, without evidence of collection or gas.  IMPRESSION: 1. Scrotal wall thickening, improved from 11/26/2014 but still marked. No fluid collection. 2. Negative testicles.   Electronically Signed   By: Marnee Spring M.D.   On: 12/11/2014 22:00     CBC  Recent Labs Lab 12/11/14 1845  WBC 12.5*  HGB 10.3*  HCT 35.5*  PLT 396  MCV 64.1*  MCH 18.6*  MCHC 29.0*  RDW 22.0*  LYMPHSABS 0.9  MONOABS 0.5  EOSABS 0.0  BASOSABS 0.0    Chemistries   Recent Labs Lab 12/11/14 1845  NA 129*  K 3.5  CL 89*  CO2 26  GLUCOSE  193*  BUN 35*  CREATININE 2.32*  CALCIUM 8.5*  AST 35  ALT 14*  ALKPHOS 205*  BILITOT 4.3*   ------------------------------------------------------------------------------------------------------------------ estimated creatinine clearance is 47.8 mL/min (by C-G formula based on Cr of 2.32). ------------------------------------------------------------------------------------------------------------------ No results for input(s): HGBA1C in the last 72 hours. ------------------------------------------------------------------------------------------------------------------ No results for input(s): CHOL, HDL, LDLCALC, TRIG, CHOLHDL, LDLDIRECT in the last 72 hours. ------------------------------------------------------------------------------------------------------------------ No results for input(s): TSH, T4TOTAL, T3FREE, THYROIDAB in the last 72 hours.  Invalid input(s):  FREET3 ------------------------------------------------------------------------------------------------------------------ No results for input(s): VITAMINB12, FOLATE, FERRITIN, TIBC, IRON, RETICCTPCT in the last 72 hours.  Coagulation profile No results for input(s): INR, PROTIME in the last 168 hours.  No results for input(s): DDIMER in the last 72 hours.  Cardiac Enzymes  Recent Labs Lab 12/11/14 1845  TROPONINI 0.04*   ------------------------------------------------------------------------------------------------------------------ Invalid input(s): POCBNP   CBG: No results for input(s): GLUCAP in the last 168 hours.     EKG: Independently reviewed. Date/Time: Thursday December 11 2014 18:00:43 EDT Ventricular Rate: 110 PR Interval: 158 QRS Duration: 94 QT Interval: 376 QTC Calculation: 508 R Axis: 28 Text Interpretation: Sinus tachycardia Possible Left atrial enlargement  Nonspecific T wave abnormality Abnormal ECG changed from prior EKG, more T  wave flattening    Assessment/Plan Principal Problem:   Sepsis-likely secondary to bilateral lower extremity cellulitis, scrotal infection, started on vancomycin Zosyn, lactic acid elevated at 3.83 now 3.6 to, patient is of an acute on chronic renal failure, creatinine has gone up from 1.26> 2.32 in the last 2 weeks. Blood culture 2. Check UA   Scrotal swelling-seen by Dr. Neva Seat, urology patient does not have any abscesses or Fournier's  on exam, no evidence of an ultrasound or CT scan. Recommend keeping the scrotum dry, wound care, follow-up with [facility]    Acute renal failure superimposed on stage 3 chronic kidney disease-baseline creatinine 1.26> now 2.32. On Lasix, ACE inhibitor, gentle hydration to see if creatinine improves, likely secondary to #1. Requested urology to place Foley for strict I's and O's    Acute on chronic systolic heart failure-recent 2-D echo shows Moderately dilated left  ventricle.The left ventricle was not well visualized, grossly Severe global LV hypokinesis. Ejection fraction is visually estimated at 20-25% Moderate tricuspid regurgitation Moderate pulmonary hypertension. Moderate mitral regurgitation Continue to  cycle cardiac enzymes,  patient is followed by cardiologist in [facility], has a history of heavy alcohol use and cocaine abuse    Hyponatremia-likely hypovolemic hyponatremia due to dehydration. Sodium was 137 on 9/15 at [facility]     OSA (obstructive sleep apnea)-currently not on CPAP    Cellulitis of leg, right-continue empiric antibiotics, will obtain wound care consultation    Anasarca-likely secondary to noncompliance with medications for heart failure   diabetes-insulin-dependent, hemoglobin A1c 8.3   Code Status:   full Family Communication: bedside Disposition Plan: admit   Total time spent 55 minutes.Greater than 50% of this time was spent in counseling, explanation of diagnosis, planning of further management, and coordination of care  [facility] Pager [phone]  If 7PM-7AM, please contact night-coverage [email or web address] Password [facility] 12/12/2014, 8:37 AM

## 2014-12-12 NOTE — Progress Notes (Signed)
Utilization Review Completed.  

## 2014-12-12 NOTE — Consult Note (Addendum)
WOC wound consult note Reason for Consult: Consult requested for scrotum wound.  Pt was assessed earlier by urology and CT determined this is not Forniers gangrene. Requested to suggest topical treatment for scrotum wound.  Wound type: Full thickness wound; 6X7cm to anterior scrotum, 100% yellow slough tightly adhered. Drainage (amount, consistency, odor) Scant amt yellow drainage, no odor. Dressing procedure/placement/frequency: This will be a difficult site to keep the dressing in place. Bedside nurse states they will be inserting a foley to control drainage where patient has constantly leaked urine and created the wound, according to urology progress notes.  Topical treatment will be minimally effective; progress notes indicate he should follow-up with [facility] after discharge. Xeroform gauze is a drying agent which will adhere over the site and assist with promoting healing.  Right leg with 2 areas of full thickness wounds; 2.5X2.5 and 1.5X1.5cm; both covered by slough/eschar. No odor, minimal amt yellow drainage. Plan: Santyl ointment to chemically debride nonviable tissue to right leg wounds.  Discussed plan of care with patient but he does not appear to understand. Please re-consult if further assistance is needed.  Thank-you,  Cammie Mcgee MSN, RN, CWOCN, [place], CNS [phone]

## 2014-12-12 NOTE — Consult Note (Signed)
Urology Consult   Physician requesting consult: Dr. Allena Katz  Reason for consult: Scrotal lesion  History of Present Illness: Tom Reynolds is a 49 y.o. with poorly controlled CHF and CKD who does not have a PCP and is in and out of hospitals with complications related to CHF. He came into the ED and was found to have bilateral cellulitis and sepsis. He also had a finding of a scrotal wound. A CT and ultrasound showed no gas and no abscess. The patient reported that he is chronically wet in the area. 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 He is unclear how long this has been going on since he has trouble seeing his scrotum but reports it was not there a couple weeks ago. He is a poor historian and is unable to communicate his baseline voiding symptoms. From what I can gather he thinks he is emptying and urinates normally other than not making it out over his buried penis.     Past Medical History  Diagnosis Date  . Diabetes mellitus without complication   . Hypertension   . CHF (congestive heart failure)     History reviewed. No pertinent past surgical history.  Current Hospital Medications:  Home Meds:    Medication List    ASK your doctor about these medications        aspirin 81 MG tablet  Take 81 mg by mouth daily.     omeprazole 40 MG capsule  Commonly known as:  PRILOSEC  Take 40 mg by mouth daily.        Scheduled Meds: . morphine       Continuous Infusions:  PRN Meds:.  Allergies:  Allergies  Allergen Reactions  . Penicillins     History reviewed. No pertinent family history.  Social History:  reports that he has quit smoking. He does not have any smokeless tobacco history on file. He reports that he does not drink alcohol. His drug history is not on file.  ROS: A complete review of systems was performed.  All systems are negative except for pertinent findings  as noted.  Physical Exam:  Vital signs in last 24 hours: Temp:  [97.2 F (36.2 C)-98 F (36.7 C)] 97.2 F (36.2 C) (09/30 0532) Pulse Rate:  [100-117] 114 (09/30 0532) Resp:  [16-22] 16 (09/30 0424) BP: (95-164)/(68-102) 107/76 mmHg (09/30 0532) SpO2:  [98 %-100 %] 100 % (09/30 0532) Weight:  [125.278 kg (276 lb 3 oz)-125.4 kg (276 lb 7.3 oz)] 125.4 kg (276 lb 7.3 oz) (09/30 0532) Constitutional:  Alert and oriented, No acute distress Cardiovascular: tachy, Regular rhythm Respiratory: Normal respiratory effort, Lungs clear bilaterally GI: Abdomen is soft, nontender, nondistended, no abdominal masses GU: No CVA tenderness, 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 Bilateral testicles palpable without tenderness. Large fibrinous exudative process over the scrotum appears to be from poor hygiene and a chronic wet state, minimal erythema with low suspicion for abscess or Fournier's  Neurologic: Grossly intact, no focal deficits      Laboratory Data:   Recent Labs  12/11/14 1845  WBC 12.5*  HGB 10.3*  HCT 35.5*  PLT 396     Recent Labs  12/11/14 1845  NA 129*  K 3.5  CL 89*  GLUCOSE 193*  BUN 35*  CALCIUM 8.5*  CREATININE 2.32*     Results for orders placed or performed during the hospital encounter  of 12/11/14 (from the past 24 hour(s))  Comprehensive metabolic panel     Status: Abnormal   Collection Time: 12/11/14  6:45 PM  Result Value Ref Range   Sodium 129 (L) 135 - 145 mmol/L   Potassium 3.5 3.5 - 5.1 mmol/L   Chloride 89 (L) 101 - 111 mmol/L   CO2 26 22 - 32 mmol/L   Glucose, Bld 193 (H) 65 - 99 mg/dL   BUN 35 (H) 6 - 20 mg/dL   Creatinine, Ser 0.63 (H) 0.61 - 1.24 mg/dL   Calcium 8.5 (L) 8.9 - 10.3 mg/dL   Total Protein 7.0 6.5 - 8.1 g/dL   Albumin 2.2 (L) 3.5 - 5.0 g/dL   AST 35 15 - 41 U/L   ALT 14 (L) 17 - 63 U/L   Alkaline Phosphatase 205 (H) 38 - 126 U/L   Total Bilirubin 4.3 (H) 0.3 - 1.2 mg/dL   GFR calc non Af Amer 31 (L) >60 mL/min    GFR calc Af Amer 36 (L) >60 mL/min   Anion gap 14 5 - 15  CBC with Differential     Status: Abnormal   Collection Time: 12/11/14  6:45 PM  Result Value Ref Range   WBC 12.5 (H) 4.0 - 10.5 K/uL   RBC 5.54 4.22 - 5.81 MIL/uL   Hemoglobin 10.3 (L) 13.0 - 17.0 g/dL   HCT 01.6 (L) 01.0 - 93.2 %   MCV 64.1 (L) 78.0 - 100.0 fL   MCH 18.6 (L) 26.0 - 34.0 pg   MCHC 29.0 (L) 30.0 - 36.0 g/dL   RDW 35.5 (H) 73.2 - 20.2 %   Platelets 396 150 - 400 K/uL   Neutrophils Relative % 89 %   Lymphocytes Relative 7 %   Monocytes Relative 4 %   Eosinophils Relative 0 %   Basophils Relative 0 %   Neutro Abs 11.1 (H) 1.7 - 7.7 K/uL   Lymphs Abs 0.9 0.7 - 4.0 K/uL   Monocytes Absolute 0.5 0.1 - 1.0 K/uL   Eosinophils Absolute 0.0 0.0 - 0.7 K/uL   Basophils Absolute 0.0 0.0 - 0.1 K/uL  Troponin I     Status: Abnormal   Collection Time: 12/11/14  6:45 PM  Result Value Ref Range   Troponin I 0.04 (H) <0.031 ng/mL  Brain natriuretic peptide     Status: Abnormal   Collection Time: 12/11/14  6:45 PM  Result Value Ref Range   B Natriuretic Peptide 1897.6 (H) 0.0 - 100.0 pg/mL  Culture, blood (routine x 2)     Status: None (Preliminary result)   Collection Time: 12/11/14  7:30 PM  Result Value Ref Range   Specimen Description      BLOOD RIGHT ANTECUBITAL Performed at [facility]    Special Requests BOTTLES DRAWN AEROBIC AND ANAEROBIC EACH    Culture PENDING    Report Status PENDING   I-Stat CG4 Lactic Acid, ED     Status: Abnormal   Collection Time: 12/11/14  7:32 PM  Result Value Ref Range   Lactic Acid, Venous 3.83 (HH) 0.5 - 2.0 mmol/L   Comment NOTIFIED PHYSICIAN   Culture, blood (routine x 2)     Status: None (Preliminary result)   Collection Time: 12/11/14  8:08 PM  Result Value Ref Range   Specimen Description      BLOOD LEFT ANTECUBITAL Performed at [facility]    Special Requests BOTTLES DRAWN AEROBIC AND ANAEROBIC EACH    Culture  PENDING    Report Status  PENDING   I-Stat CG4 Lactic Acid, ED  (not at  [facility])     Status: Abnormal   Collection Time: 12/11/14 10:59 PM  Result Value Ref Range   Lactic Acid, Venous 3.62 (HH) 0.5 - 2.0 mmol/L   Comment NOTIFIED PHYSICIAN    Recent Results (from the past 240 hour(s))  Culture, blood (routine x 2)     Status: None (Preliminary result)   Collection Time: 12/11/14  7:30 PM  Result Value Ref Range Status   Specimen Description   Final    BLOOD RIGHT ANTECUBITAL Performed at [facility]    Special Requests BOTTLES DRAWN AEROBIC AND ANAEROBIC EACH  Final   Culture PENDING  Incomplete   Report Status PENDING  Incomplete  Culture, blood (routine x 2)     Status: None (Preliminary result)   Collection Time: 12/11/14  8:08 PM  Result Value Ref Range Status   Specimen Description   Final    BLOOD LEFT ANTECUBITAL Performed at [facility]    Special Requests BOTTLES DRAWN AEROBIC AND ANAEROBIC EACH  Final   Culture PENDING  Incomplete   Report Status PENDING  Incomplete    Renal Function:  Recent Labs  12/11/14 1845  CREATININE 2.32*   Estimated Creatinine Clearance: 47.8 mL/min (by C-G formula based on Cr of 2.32).  Radiologic Imaging: Dg Chest 2 View  12/11/2014   CLINICAL DATA:  Shortness of breath and scrotal swelling  EXAM: CHEST  2 VIEW  COMPARISON:  December 09, 2014  FINDINGS: The heart size is enlarged. The mediastinal contour is normal. There is no focal infiltrate, pulmonary edema, or pleural effusion. Degenerative joint changes of the spine are noted.  IMPRESSION: No active cardiopulmonary disease.  Cardiomegaly.   Electronically Signed   By: Sherian Rein M.D.   On: 12/11/2014 21:33   Ct Pelvis Wo Contrast  12/12/2014   CLINICAL DATA:  Sores and scrotal thickening. Evaluate for 48 gangrene.  EXAM: CT PELVIS WITHOUT CONTRAST  TECHNIQUE: Multidetector CT imaging of the pelvis was performed following the standard protocol without intravenous contrast.   COMPARISON:  None.  FINDINGS: Diffuse, severe submucosal reticulation and skin thickening, including on the symptomatic scrotum. There is no soft tissue gas or evidence of fluid collection. No ulceration or bone infection.  Reactive appearing bilateral inguinal lymph node enlargement.  Small peritoneal fluid in the pelvis, likely from volume overload. Negative pelvic visceral.  Lower lumbar facet arthropathy.  IMPRESSION: Extensive anasarca, which could obscure superimposed cellulitis. No soft tissue gas or abscess.   Electronically Signed   By: Marnee Spring M.D.   On: 12/12/2014 00:48   US Scrotum  12/11/2014   CLINICAL DATA:  Scrotal swelling for several weeks  EXAM: SCROTAL ULTRASOUND  DOPPLER ULTRASOUND OF THE TESTICLES  TECHNIQUE: Complete ultrasound examination of the testicles, epididymis, and other scrotal structures was performed. Color and spectral Doppler ultrasound were also utilized to evaluate blood flow to the testicles.  COMPARISON:  11/26/2014  FINDINGS: Right testicle  Measurements: 41 x 28 x 29 mm. No mass or microlithiasis visualized.  Left testicle  Measurements: 38 x 28 x 28 mm. No mass or microlithiasis visualized.  Right epididymis: Not able to be visualized. No indication of enlargement or hypervascularity.  Left epididymis: Not able to be visualized. No indication of enlargement or hypervascularity.  Hydrocele:  None visualized.  Varicocele:  None visualized.  Pulsed Doppler interrogation of both testes  demonstrates normal low resistance arterial waveforms bilaterally. Detection and venous waveforms was more difficult, likely hindered by skin thickening.  Improved but still marked thickening of the scrotal wall diffusely, without evidence of collection or gas.  IMPRESSION: 1. Scrotal wall thickening, improved from 11/26/2014 but still marked. No fluid collection. 2. Negative testicles.   Electronically Signed   By: Marnee Spring M.D.   On: 12/11/2014 22:00   [place] Art/ven Flow Abd  Pelv Doppler  12/11/2014   CLINICAL DATA:  Scrotal swelling for several weeks  EXAM: SCROTAL ULTRASOUND  DOPPLER ULTRASOUND OF THE TESTICLES  TECHNIQUE: Complete ultrasound examination of the testicles, epididymis, and other scrotal structures was performed. Color and spectral Doppler ultrasound were also utilized to evaluate blood flow to the testicles.  COMPARISON:  11/26/2014  FINDINGS: Right testicle  Measurements: 41 x 28 x 29 mm. No mass or microlithiasis visualized.  Left testicle  Measurements: 38 x 28 x 28 mm. No mass or microlithiasis visualized.  Right epididymis: Not able to be visualized. No indication of enlargement or hypervascularity.  Left epididymis: Not able to be visualized. No indication of enlargement or hypervascularity.  Hydrocele:  None visualized.  Varicocele:  None visualized.  Pulsed Doppler interrogation of both testes demonstrates normal low resistance arterial waveforms bilaterally. Detection and venous waveforms was more difficult, likely hindered by skin thickening.  Improved but still marked thickening of the scrotal wall diffusely, without evidence of collection or gas.  IMPRESSION: 1. Scrotal wall thickening, improved from 11/26/2014 but still marked. No fluid collection. 2. Negative testicles.   Electronically Signed   By: Marnee Spring M.D.   On: 12/11/2014 22:00    I independently reviewed the above imaging studies.  Impression/Recommendation 49 y.o. male with many poorly controlled medical issues and poor hygiene. The patient does not have an abscess or Fournier's on exam, ultrasound or CT.  - Keep scrotum dry - wound care for dressing strategies - I taught the patient how to push back his fat pad to allow full use of his penis, which will hopefully allow him to urinate over the affected area, he was not cooperative in this teaching - I spoke with the patient about the need for close and ongoing primary care as his medical issues are related to poor control of  his chronic condition and that is will be difficult to heal any wounds without addressing his underlying conditions.  - The patient will follow up at [facility]  Juliane Poot 12/12/2014, 7:18 AM

## 2014-12-12 NOTE — Clinical Social Work Note (Addendum)
CSW received consult for patient needing assistance with applying for SSI and Medicaid.  CSW received a phone call from patient's sister Darl Pikes who stated her brother was wanting some information about how to apply for SSI and Medicaid.  CSW informed patient's sister that financial advisors usually help patients with applying for benefits.  CSW contacted patient financial resource specialist and left a voice mail about patient wanting assistance with applying for disability and medicaid.  CSW to sign off please reconsult if other social work needs arise.  Ervin Knack. Anterhaus, MSW, Theresia Majors [phone] 12/12/2014 4:43 PM

## 2014-12-12 NOTE — Progress Notes (Signed)
Pt admitted to 2C14, it is alert and orientated x 4, very innappropriate and in side conversation mentioned being high on drugs d/t pain. In report pt has not voided since he arrived in ED. Asked pt to void and after many attempts pt unable, he states he feels like he does not need too and bladder scan showed . Pt lives with a friend and stated that he has many issues with finances as far as getting medication and going to the doctor. Pt noted to have many wounds on leg and stomach along with redness and swelling bilaterally. Sacrum swollen and very painful along with white puss on outside. Will make MD aware of pt unable to void and pass along to next shift.

## 2014-12-13 DIAGNOSIS — E871 Hypo-osmolality and hyponatremia: Secondary | ICD-10-CM

## 2014-12-13 DIAGNOSIS — N5089 Other specified disorders of the male genital organs: Secondary | ICD-10-CM

## 2014-12-13 LAB — TROPONIN I: Troponin I: 0.05 ng/mL — ABNORMAL HIGH (ref <0.031)

## 2014-12-13 LAB — GLUCOSE, CAPILLARY
GLUCOSE-CAPILLARY: 114 mg/dL — AB (ref 65–99)
Glucose-Capillary: 131 mg/dL — ABNORMAL HIGH (ref 65–99)
Glucose-Capillary: 112 mg/dL — ABNORMAL HIGH (ref 65–99)
Glucose-Capillary: 100 mg/dL — ABNORMAL HIGH (ref 65–99)

## 2014-12-13 LAB — URINE CULTURE: CULTURE: NO GROWTH

## 2014-12-13 LAB — HEMOGLOBIN A1C
Hgb A1c MFr Bld: 8.9 % — ABNORMAL HIGH (ref 4.8–5.6)
Mean Plasma Glucose: 209 mg/dL

## 2014-12-13 MED ORDER — THIAMINE HCL 100 MG/ML IJ SOLN
100.0000 mg | Freq: Every day | INTRAMUSCULAR | Status: DC
  Administered 2014-12-13 – 2014-12-14 (×2): 100 mg via INTRAVENOUS
  Filled 2014-12-13 (×3): qty 2

## 2014-12-13 MED ORDER — VITAMIN B-1 100 MG PO TABS
100.0000 mg | ORAL_TABLET | Freq: Every day | ORAL | Status: DC
  Administered 2014-12-15 – 2014-12-28 (×13): 100 mg via ORAL
  Filled 2014-12-13 (×13): qty 1

## 2014-12-13 MED ORDER — LORAZEPAM 2 MG/ML IJ SOLN
2.0000 mg | Freq: Once | INTRAMUSCULAR | Status: AC
  Administered 2014-12-13: 2 mg via INTRAMUSCULAR
  Filled 2014-12-13: qty 1

## 2014-12-13 MED ORDER — LORAZEPAM 1 MG PO TABS: 1.0000 mg | ORAL_TABLET | Freq: Four times a day (QID) | ORAL | Status: AC | PRN

## 2014-12-13 MED ORDER — LORAZEPAM 1 MG PO TABS
0.0000 mg | ORAL_TABLET | Freq: Two times a day (BID) | ORAL | Status: AC
Start: 2014-12-15 — End: 2014-12-17
  Administered 2014-12-15 – 2014-12-17 (×2): 2 mg via ORAL
  Filled 2014-12-13 (×3): qty 2

## 2014-12-13 MED ORDER — LORAZEPAM 1 MG PO TABS
0.0000 mg | ORAL_TABLET | Freq: Four times a day (QID) | ORAL | Status: AC
Start: 2014-12-13 — End: 2014-12-15
  Administered 2014-12-13: 2 mg via ORAL
  Administered 2014-12-14 – 2014-12-15 (×3): 1 mg via ORAL
  Filled 2014-12-13: qty 2
  Filled 2014-12-13 (×2): qty 1
  Filled 2014-12-13: qty 2

## 2014-12-13 MED ORDER — FOLIC ACID 1 MG PO TABS
1.0000 mg | ORAL_TABLET | Freq: Every day | ORAL | Status: DC
Start: 2014-12-13 — End: 2014-12-29
  Administered 2014-12-14 – 2014-12-28 (×14): 1 mg via ORAL
  Filled 2014-12-13 (×15): qty 1

## 2014-12-13 MED ORDER — ADULT MULTIVITAMIN W/MINERALS CH
1.0000 | ORAL_TABLET | Freq: Every day | ORAL | Status: DC
  Administered 2014-12-14 – 2014-12-28 (×14): 1 via ORAL
  Filled 2014-12-13 (×14): qty 1

## 2014-12-13 MED ORDER — LORAZEPAM 2 MG/ML IJ SOLN
1.0000 mg | Freq: Four times a day (QID) | INTRAMUSCULAR | Status: AC | PRN
  Administered 2014-12-14 – 2014-12-16 (×4): 1 mg via INTRAVENOUS
  Filled 2014-12-13 (×5): qty 1

## 2014-12-13 NOTE — Progress Notes (Signed)
Patient agitated, reporting hallucinations and threatening elopement. Family has been contacted and agrees that patient needs to continue hospital care. When relayed to patient he did not appear to comprehend the information and repeatedly talked about "repelling down the wall" and out of here. Now combative and pulled out IV, removed telemetry leads. Security at bedside and patient refusing all care.  Have again spoke to family (sisters Darl Pikes and Clydie Braun) and involuntary commitment in process for patient with sepsis/infection (positive blood cultures), concern for progression of infection to end organ damage.  Tom Rodd A.

## 2014-12-13 NOTE — Progress Notes (Signed)
[facility] PROGRESS NOTE  Laval Cafaro ZOX:[id number] DOB: September 25, 1965 DOA: 12/11/2014 PCP: No primary care provider on file.  Assessment/Plan: Principal Problem:   Sepsis - f/u with cultures - continue current antibiotic regimen.   Active Problems:  History of alcohol abuse - Placed on CIWA protocol - Ativan in place prn    Acute renal failure superimposed on stage 3 chronic kidney disease   Acute on chronic systolic heart failure   Hyponatremia - Most likely due to poor oral solute intake - Trending up on last check in the last value reported at 131   OSA (obstructive sleep apnea)   Anasarca   Cellulitis - continue current antibiotic regimen, with improvement in condition will narrow antibiotic regimen.   Scrotal swelling - Wound consult placed - Urology has evaluated - [place] of scrotum performed and reported improvement in scrotal wall thickening when compared to study on 11/26/14 - CT of pelvis reported no soft tissue gas or abscess   Code Status: full Family Communication: None at bedside Disposition Plan: Pending improvement in condition.   Consultants:  Urology  Procedures:  None  Antibiotics:  Aztreonam  Vancomycin  HPI/Subjective: Pt resting comfortably.   Objective: Filed Vitals:   12/13/14 1132  BP:   Pulse:   Temp: 97.5 F (36.4 C)  Resp:     Intake/Output Summary (Last 24 hours) at 12/13/14 1402 Last data filed at 12/13/14 1300  Gross per 24 hour  Intake 1172.5 ml  Output    350 ml  Net  822.5 ml   Filed Weights   12/11/14 1825 12/12/14 0532  Weight: 125.278 kg (276 lb 3 oz) 125.4 kg (276 lb 7.3 oz)    Exam:   General:  Pt resting but arrousable, in nad  Cardiovascular: no cyanosis  Respiratory: no increased wob, no wheezes  Abdomen: soft, obese, no guarding  Musculoskeletal: no cyanosis or clubbing   Data Reviewed: Basic Metabolic Panel:  Recent Labs Lab 12/11/14 1845 12/12/14 1231  NA 129* 131*  K 3.5  4.1  CL 89* 95*  CO2 26 21*  GLUCOSE 193* 132*  BUN 35* 37*  CREATININE 2.32* 2.86*  CALCIUM 8.5* 8.4*  MG  --  1.8   Liver Function Tests:  Recent Labs Lab 12/11/14 1845 12/12/14 1231  AST 35 34  ALT 14* 14*  ALKPHOS 205* 196*  BILITOT 4.3* 4.1*  PROT 7.0 6.4*  ALBUMIN 2.2* 1.9*   No results for input(s): LIPASE, AMYLASE in the last 168 hours. No results for input(s): AMMONIA in the last 168 hours. CBC:  Recent Labs Lab 12/11/14 1845 12/12/14 1231  WBC 12.5* 16.7*  NEUTROABS 11.1*  --   HGB 10.3* 10.4*  HCT 35.5* 36.7*  MCV 64.1* 67.1*  PLT 396 331   Cardiac Enzymes:  Recent Labs Lab 12/11/14 1845 12/12/14 1231 12/12/14 2025 12/13/14 0043  TROPONINI 0.04* 0.08* 0.08* 0.05*   BNP (last 3 results)  Recent Labs  12/11/14 1845  BNP 1897.6*    ProBNP (last 3 results) No results for input(s): PROBNP in the last 8760 hours.  CBG:  Recent Labs Lab 12/12/14 0903 12/12/14 1634 12/12/14 2156 12/13/14 0734 12/13/14 1127  GLUCAP 158* 189* 140* 114* 131*    Recent Results (from the past 240 hour(s))  Culture, blood (routine x 2)     Status: None (Preliminary result)   Collection Time: 12/11/14  7:30 PM  Result Value Ref Range Status   Specimen Description BLOOD RIGHT ANTECUBITAL  Final  Special Requests BOTTLES DRAWN AEROBIC AND ANAEROBIC EACH  Final   Culture  Setup Time   Final    GRAM POSITIVE COCCI IN CLUSTERS ANAEROBIC BOTTLE ONLY CRITICAL RESULT CALLED TO, READ BACK BY AND VERIFIED WITH: B REAP RN [id number] 12/12/14 A BROWNING    Culture   Final    TOO YOUNG TO READ Performed at [facility]    Report Status PENDING  Incomplete  Culture, blood (routine x 2)     Status: None (Preliminary result)   Collection Time: 12/11/14  8:08 PM  Result Value Ref Range Status   Specimen Description BLOOD LEFT ANTECUBITAL  Final   Special Requests BOTTLES DRAWN AEROBIC AND ANAEROBIC EACH  Final   Culture   Final    NO GROWTH < 24  HOURS Performed at [facility]    Report Status PENDING  Incomplete  MRSA PCR Screening     Status: None   Collection Time: 12/12/14  5:38 AM  Result Value Ref Range Status   MRSA by PCR NEGATIVE NEGATIVE Final    Comment:        The GeneXpert MRSA Assay (FDA approved for NASAL specimens only), is one component of a comprehensive MRSA colonization surveillance program. It is not intended to diagnose MRSA infection nor to guide or monitor treatment for MRSA infections.   Urine culture     Status: None   Collection Time: 12/12/14 12:45 PM  Result Value Ref Range Status   Specimen Description URINE, CLEAN CATCH  Final   Special Requests NONE  Final   Culture NO GROWTH 1 DAY  Final   Report Status 12/13/2014 FINAL  Final     Studies: Dg Chest 2 View  12/11/2014   CLINICAL DATA:  Shortness of breath and scrotal swelling  EXAM: CHEST  2 VIEW  COMPARISON:  December 09, 2014  FINDINGS: The heart size is enlarged. The mediastinal contour is normal. There is no focal infiltrate, pulmonary edema, or pleural effusion. Degenerative joint changes of the spine are noted.  IMPRESSION: No active cardiopulmonary disease.  Cardiomegaly.   Electronically Signed   By: Sherian Rein M.D.   On: 12/11/2014 21:33   Ct Pelvis Wo Contrast  12/12/2014   CLINICAL DATA:  Sores and scrotal thickening. Evaluate for 48 gangrene.  EXAM: CT PELVIS WITHOUT CONTRAST  TECHNIQUE: Multidetector CT imaging of the pelvis was performed following the standard protocol without intravenous contrast.  COMPARISON:  None.  FINDINGS: Diffuse, severe submucosal reticulation and skin thickening, including on the symptomatic scrotum. There is no soft tissue gas or evidence of fluid collection. No ulceration or bone infection.  Reactive appearing bilateral inguinal lymph node enlargement.  Small peritoneal fluid in the pelvis, likely from volume overload. Negative pelvic visceral.  Lower lumbar facet arthropathy.  IMPRESSION:  Extensive anasarca, which could obscure superimposed cellulitis. No soft tissue gas or abscess.   Electronically Signed   By: Marnee Spring M.D.   On: 12/12/2014 00:48   US Scrotum  12/11/2014   CLINICAL DATA:  Scrotal swelling for several weeks  EXAM: SCROTAL ULTRASOUND  DOPPLER ULTRASOUND OF THE TESTICLES  TECHNIQUE: Complete ultrasound examination of the testicles, epididymis, and other scrotal structures was performed. Color and spectral Doppler ultrasound were also utilized to evaluate blood flow to the testicles.  COMPARISON:  11/26/2014  FINDINGS: Right testicle  Measurements: 41 x 28 x 29 mm. No mass or microlithiasis visualized.  Left testicle  Measurements: 38 x 28 x  28 mm. No mass or microlithiasis visualized.  Right epididymis: Not able to be visualized. No indication of enlargement or hypervascularity.  Left epididymis: Not able to be visualized. No indication of enlargement or hypervascularity.  Hydrocele:  None visualized.  Varicocele:  None visualized.  Pulsed Doppler interrogation of both testes demonstrates normal low resistance arterial waveforms bilaterally. Detection and venous waveforms was more difficult, likely hindered by skin thickening.  Improved but still marked thickening of the scrotal wall diffusely, without evidence of collection or gas.  IMPRESSION: 1. Scrotal wall thickening, improved from 11/26/2014 but still marked. No fluid collection. 2. Negative testicles.   Electronically Signed   By: Marnee Spring M.D.   On: 12/11/2014 22:00   [place] Art/ven Flow Abd Pelv Doppler  12/11/2014   CLINICAL DATA:  Scrotal swelling for several weeks  EXAM: SCROTAL ULTRASOUND  DOPPLER ULTRASOUND OF THE TESTICLES  TECHNIQUE: Complete ultrasound examination of the testicles, epididymis, and other scrotal structures was performed. Color and spectral Doppler ultrasound were also utilized to evaluate blood flow to the testicles.  COMPARISON:  11/26/2014  FINDINGS: Right testicle  Measurements: 41 x  28 x 29 mm. No mass or microlithiasis visualized.  Left testicle  Measurements: 38 x 28 x 28 mm. No mass or microlithiasis visualized.  Right epididymis: Not able to be visualized. No indication of enlargement or hypervascularity.  Left epididymis: Not able to be visualized. No indication of enlargement or hypervascularity.  Hydrocele:  None visualized.  Varicocele:  None visualized.  Pulsed Doppler interrogation of both testes demonstrates normal low resistance arterial waveforms bilaterally. Detection and venous waveforms was more difficult, likely hindered by skin thickening.  Improved but still marked thickening of the scrotal wall diffusely, without evidence of collection or gas.  IMPRESSION: 1. Scrotal wall thickening, improved from 11/26/2014 but still marked. No fluid collection. 2. Negative testicles.   Electronically Signed   By: Marnee Spring M.D.   On: 12/11/2014 22:00    Scheduled Meds: . aztreonam  1 g Intravenous 3 times per day  . collagenase   Topical Daily  . docusate sodium  100 mg Oral BID  . folic acid  1 mg Oral Daily  . heparin  5,000 Units Subcutaneous 3 times per day  . insulin aspart  0-9 Units Subcutaneous TID WC  . LORazepam  0-4 mg Oral Q6H   Followed by  . [START ON 12/15/2014] LORazepam  0-4 mg Oral Q12H  . multivitamin with minerals  1 tablet Oral Daily  . thiamine  100 mg Oral Daily   Or  . thiamine  100 mg Intravenous Daily  . vancomycin  1,500 mg Intravenous Q24H   Continuous Infusions: . sodium chloride 75 mL/hr at 12/12/14 1719    Time spent: > 35 minutes    Penny Pia  [facility] Pager [phone] If 7PM-7AM, please contact night-coverage at [email or web address], password [facility] 12/13/2014, 2:02 PM  LOS: 1 day

## 2014-12-13 NOTE — Progress Notes (Signed)
IVC paperwork served to patient.

## 2014-12-13 NOTE — Consult Note (Signed)
WOC wound consult note Reason for Consult: Chronic scrotal wound with eschar.  Patient not seen today as WOC Consult was performed yesterday morning (12/12/14) by my partner, D. Sylvie Farrier.  See note.  No change in topical care is indicated at this time.  WOC nursing team will not follow, but will remain available to this patient, the nursing and medical teams.  Please re-consult if needed. Thanks, Ladona Mow, MSN, RN, GNP, [place], [facility] [phone])

## 2014-12-13 NOTE — Progress Notes (Signed)
IVC paperwork faxed to magistrate by [facility], security put pt back in bed, pt is calm now and agreeable to treatment at this time. Will continue to assess.

## 2014-12-14 LAB — CBC
HCT: 37.7 % — ABNORMAL LOW (ref 39.0–52.0)
Hemoglobin: 10.9 g/dL — ABNORMAL LOW (ref 13.0–17.0)
MCH: 19.2 pg — ABNORMAL LOW (ref 26.0–34.0)
MCHC: 28.9 g/dL — ABNORMAL LOW (ref 30.0–36.0)
MCV: 66.4 fL — ABNORMAL LOW (ref 78.0–100.0)
PLATELETS: 275 10*3/uL (ref 150–400)
RBC: 5.68 MIL/uL (ref 4.22–5.81)
RDW: 21 % — ABNORMAL HIGH (ref 11.5–15.5)
WBC: 9.8 10*3/uL (ref 4.0–10.5)

## 2014-12-14 LAB — GLUCOSE, CAPILLARY
GLUCOSE-CAPILLARY: 149 mg/dL — AB (ref 65–99)
Glucose-Capillary: 370 mg/dL — ABNORMAL HIGH (ref 65–99)
Glucose-Capillary: 147 mg/dL — ABNORMAL HIGH (ref 65–99)

## 2014-12-14 LAB — BASIC METABOLIC PANEL
ANION GAP: 13 (ref 5–15)
BUN: 43 mg/dL — AB (ref 6–20)
CO2: 24 mmol/L (ref 22–32)
Calcium: 8 mg/dL — ABNORMAL LOW (ref 8.9–10.3)
Chloride: 94 mmol/L — ABNORMAL LOW (ref 101–111)
Creatinine, Ser: 2.74 mg/dL — ABNORMAL HIGH (ref 0.61–1.24)
GFR, EST AFRICAN AMERICAN: 29 mL/min — AB (ref >60)
GFR, EST NON AFRICAN AMERICAN: 25 mL/min — AB (ref >60)
Glucose, Bld: 119 mg/dL — ABNORMAL HIGH (ref 65–99)
POTASSIUM: 4.1 mmol/L (ref 3.5–5.1)
SODIUM: 131 mmol/L — AB (ref 135–145)
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
## 2014-12-14 NOTE — Progress Notes (Signed)
[facility] PROGRESS NOTE  Tom Reynolds RUE:[id number] DOB: April 05, 1965 DOA: 12/11/2014 PCP: No primary care provider on file.  Assessment/Plan: Principal Problem:   Sepsis - f/u with cultures: One out of 2 blood cultures positive - continue current antibiotic regimen.  - WBC trending down  1 positive blood culture -We'll continue broad-spectrum antibiotics regimen until we have assessed whether this positive blood culture is contaminant.  Active Problems:  History of alcohol abuse - Continue on CIWA protocol - Ativan in place prn    Acute renal failure superimposed on stage 3 chronic kidney disease   Acute on chronic systolic heart failure   Hyponatremia - Most likely due to poor oral solute intake - Stable   OSA (obstructive sleep apnea)   Anasarca   Cellulitis - continue current antibiotic regimen, with improvement in condition will narrow antibiotic regimen.   Scrotal swelling - Wound consult placed - Urology has evaluated - [place] of scrotum performed and reported improvement in scrotal wall thickening when compared to study on 11/26/14 - CT of pelvis reported no soft tissue gas or abscess   Code Status: full Family Communication: None at bedside Disposition Plan: Pending improvement in condition.   Consultants:  Urology  Procedures:  None  Antibiotics:  Aztreonam  Vancomycin  HPI/Subjective: Pt resting comfortably. No new complaints reported.  Objective: Filed Vitals:   12/14/14 1200  BP: 94/66  Pulse: 112  Temp:   Resp: 11    Intake/Output Summary (Last 24 hours) at 12/14/14 1432 Last data filed at 12/14/14 1200  Gross per 24 hour  Intake   3260 ml  Output   1775 ml  Net   1485 ml   Filed Weights   12/11/14 1825 12/12/14 0532  Weight: 125.278 kg (276 lb 3 oz) 125.4 kg (276 lb 7.3 oz)    Exam:   General:  Pt resting but arrousable, in nad  Cardiovascular: no cyanosis  Respiratory: no increased wob, no wheezes  Abdomen:  soft, obese, no guarding  Musculoskeletal: no cyanosis or clubbing   Data Reviewed: Basic Metabolic Panel:  Recent Labs Lab 12/11/14 1845 12/12/14 1231 12/14/14 0413  NA 129* 131* 131*  K 3.5 4.1 4.1  CL 89* 95* 94*  CO2 26 21* 24  GLUCOSE 193* 132* 119*  BUN 35* 37* 43*  CREATININE 2.32* 2.86* 2.74*  CALCIUM 8.5* 8.4* 8.0*  MG  --  1.8  --    Liver Function Tests:  Recent Labs Lab 12/11/14 1845 12/12/14 1231  AST 35 34  ALT 14* 14*  ALKPHOS 205* 196*  BILITOT 4.3* 4.1*  PROT 7.0 6.4*  ALBUMIN 2.2* 1.9*   No results for input(s): LIPASE, AMYLASE in the last 168 hours. No results for input(s): AMMONIA in the last 168 hours. CBC:  Recent Labs Lab 12/11/14 1845 12/12/14 1231 12/14/14 0413  WBC 12.5* 16.7* 9.8  NEUTROABS 11.1*  --   --   HGB 10.3* 10.4* 10.9*  HCT 35.5* 36.7* 37.7*  MCV 64.1* 67.1* 66.4*  PLT 396 331 275   Cardiac Enzymes:  Recent Labs Lab 12/11/14 1845 12/12/14 1231 12/12/14 2025 12/13/14 0043  TROPONINI 0.04* 0.08* 0.08* 0.05*   BNP (last 3 results)  Recent Labs  12/11/14 1845  BNP 1897.6*    ProBNP (last 3 results) No results for input(s): PROBNP in the last 8760 hours.  CBG:  Recent Labs Lab 12/13/14 0734 12/13/14 1127 12/13/14 1542 12/13/14 2212 12/14/14 1126  GLUCAP 114* 131* 112* 100* 149*  Recent Results (from the past 240 hour(s))  Culture, blood (routine x 2)     Status: None (Preliminary result)   Collection Time: 12/11/14  7:30 PM  Result Value Ref Range Status   Specimen Description BLOOD RIGHT ANTECUBITAL  Final   Special Requests BOTTLES DRAWN AEROBIC AND ANAEROBIC EACH  Final   Culture  Setup Time   Final    GRAM POSITIVE COCCI IN CLUSTERS ANAEROBIC BOTTLE ONLY CRITICAL RESULT CALLED TO, READ BACK BY AND VERIFIED WITH: B REAP RN 2325 12/12/14 A BROWNING    Culture   Final    GRAM POSITIVE COCCI Performed at [facility]    Report Status PENDING  Incomplete  Culture, blood  (routine x 2)     Status: None (Preliminary result)   Collection Time: 12/11/14  8:08 PM  Result Value Ref Range Status   Specimen Description BLOOD LEFT ANTECUBITAL  Final   Special Requests BOTTLES DRAWN AEROBIC AND ANAEROBIC EACH  Final   Culture   Final    NO GROWTH 2 DAYS Performed at [facility]    Report Status PENDING  Incomplete  MRSA PCR Screening     Status: None   Collection Time: 12/12/14  5:38 AM  Result Value Ref Range Status   MRSA by PCR NEGATIVE NEGATIVE Final    Comment:        The GeneXpert MRSA Assay (FDA approved for NASAL specimens only), is one component of a comprehensive MRSA colonization surveillance program. It is not intended to diagnose MRSA infection nor to guide or monitor treatment for MRSA infections.   Urine culture     Status: None   Collection Time: 12/12/14 12:45 PM  Result Value Ref Range Status   Specimen Description URINE, CLEAN CATCH  Final   Special Requests NONE  Final   Culture NO GROWTH 1 DAY  Final   Report Status 12/13/2014 FINAL  Final     Studies: No results found.  Scheduled Meds: . aztreonam  1 g Intravenous 3 times per day  . collagenase   Topical Daily  . docusate sodium  100 mg Oral BID  . folic acid  1 mg Oral Daily  . heparin  5,000 Units Subcutaneous 3 times per day  . insulin aspart  0-9 Units Subcutaneous TID WC  . LORazepam  0-4 mg Oral Q6H   Followed by  . [START ON 12/15/2014] LORazepam  0-4 mg Oral Q12H  . multivitamin with minerals  1 tablet Oral Daily  . thiamine  100 mg Oral Daily   Or  . thiamine  100 mg Intravenous Daily  . vancomycin  1,500 mg Intravenous Q24H   Continuous Infusions: . sodium chloride 75 mL/hr at 12/14/14 1610    Time spent: > 35 minutes    Penny Pia  [facility] Pager [phone] If 7PM-7AM, please contact night-coverage at [email or web address], password [facility] 12/14/2014, 2:32 PM  LOS: 2 days

## 2014-12-15 LAB — CULTURE, BLOOD (ROUTINE X 2)

## 2014-12-15 LAB — GLUCOSE, CAPILLARY
GLUCOSE-CAPILLARY: 187 mg/dL — AB (ref 65–99)
GLUCOSE-CAPILLARY: 147 mg/dL — AB (ref 65–99)
Glucose-Capillary: 201 mg/dL — ABNORMAL HIGH (ref 65–99)
Glucose-Capillary: 124 mg/dL — ABNORMAL HIGH (ref 65–99)

## 2014-12-15 LAB — VANCOMYCIN, TROUGH: Vancomycin Tr: 30 ug/mL (ref 10.0–20.0)

## 2014-12-15 MED ORDER — CLINDAMYCIN PHOSPHATE 600 MG/50ML IV SOLN
600.0000 mg | Freq: Three times a day (TID) | INTRAVENOUS | Status: DC
  Administered 2014-12-16 (×3): 600 mg via INTRAVENOUS
  Filled 2014-12-15 (×4): qty 50

## 2014-12-15 NOTE — Progress Notes (Addendum)
Inpatient Diabetes Program Recommendations  AACE/ADA: New Consensus Statement on Inpatient Glycemic Control (2015)  Target Ranges:  Prepandial:   less than 140 mg/dL      Peak postprandial:   less than 180 mg/dL (1-2 hours)      Critically ill patients:  140 - 180 mg/dL   Review of Glycemic Control  Diabetes history: none noted, HgbA1C 8.9%.  Outpatient Diabetes medications: none noted Current orders for Inpatient glycemic control: Sensitive tidwc  Inpatient Diabetes Program Recommendations:  Diet: Ordered diet for carbohydrate modified/heart -glucose running high   Order will need be co-signed. With CRF, may want to add renal restrictions to diet orders.  Thank you Lenor Coffin, RN, MSN, CDE  Diabetes Inpatient Program Office: [phone] Pager: [phone] 8:00 am to 5:00 pm

## 2014-12-15 NOTE — Progress Notes (Signed)
ANTIBIOTIC CONSULT NOTE - FOLLOW UP  Pharmacy Consult for Clindamycin Indication: Cellulitis   Allergies  Allergen Reactions  . Penicillins Other (See Comments)    intolerance    Patient Measurements: Height:  (170.2 cm) Weight: 276 lb 7.3 oz (125.4 kg) IBW/kg (Calculated) : 66.1  Vital Signs: Temp: 98.6 F (37 C) (10/03 1211) Temp Source: Oral (10/03 1211) BP: 102/76 mmHg (10/03 1444) Pulse Rate: 118 (10/03 1444) Intake/Output from previous day: 10/02 0701 - 10/03 0700 In: 2688.8 [P.O.:360; I.V.:1678.8; IV Piggyback:650] Out: 2225 [Urine:2225] Intake/Output from this shift: Total I/O In: 1290 [P.O.:840; I.V.:450] Out: 425 [Urine:425]  Labs:  Recent Labs  12/14/14 0413  WBC 9.8  HGB 10.9*  PLT 275  CREATININE 2.74*   Estimated Creatinine Clearance: 40.5 mL/min (by C-G formula based on Cr of 2.74).  Recent Labs  12/15/14 0845  VANCOTROUGH 30*     Microbiology: Recent Results (from the past 720 hour(s))  Culture, blood (routine x 2)     Status: None   Collection Time: 12/11/14  7:30 PM  Result Value Ref Range Status   Specimen Description BLOOD RIGHT ANTECUBITAL  Final   Special Requests BOTTLES DRAWN AEROBIC AND ANAEROBIC EACH  Final   Culture  Setup Time   Final    GRAM POSITIVE COCCI IN CLUSTERS ANAEROBIC BOTTLE ONLY CRITICAL RESULT CALLED TO, READ BACK BY AND VERIFIED WITH: B REAP RN 2325 12/12/14 A BROWNING    Culture   Final    STAPHYLOCOCCUS SPECIES (COAGULASE NEGATIVE) THE SIGNIFICANCE OF ISOLATING THIS ORGANISM FROM A SINGLE SET OF BLOOD CULTURES WHEN MULTIPLE SETS ARE DRAWN IS UNCERTAIN. PLEASE NOTIFY THE MICROBIOLOGY DEPARTMENT WITHIN ONE WEEK IF SPECIATION AND SENSITIVITIES ARE REQUIRED. Performed at [facility]    Report Status 12/15/2014 FINAL  Final  Culture, blood (routine x 2)     Status: None (Preliminary result)   Collection Time: 12/11/14  8:08 PM  Result Value Ref Range Status   Specimen Description BLOOD LEFT  ANTECUBITAL  Final   Special Requests BOTTLES DRAWN AEROBIC AND ANAEROBIC EACH  Final   Culture   Final    NO GROWTH 4 DAYS Performed at [facility]    Report Status PENDING  Incomplete  MRSA PCR Screening     Status: None   Collection Time: 12/12/14  5:38 AM  Result Value Ref Range Status   MRSA by PCR NEGATIVE NEGATIVE Final    Comment:        The GeneXpert MRSA Assay (FDA approved for NASAL specimens only), is one component of a comprehensive MRSA colonization surveillance program. It is not intended to diagnose MRSA infection nor to guide or monitor treatment for MRSA infections.   Urine culture     Status: None   Collection Time: 12/12/14 12:45 PM  Result Value Ref Range Status   Specimen Description URINE, CLEAN CATCH  Final   Special Requests NONE  Final   Culture NO GROWTH 1 DAY  Final   Report Status 12/13/2014 FINAL  Final    Anti-infectives    Start     Dose/Rate Route Frequency Ordered Stop   12/12/14 0800  vancomycin (VANCOCIN) 1,500 mg in sodium chloride 0.9 % 500 mL IVPB  Status:  Discontinued     1,500 mg 250 mL/hr over 120 Minutes Intravenous Every 24 hours 12/12/14 0747 12/15/14 0945   12/12/14 0800  aztreonam (AZACTAM) 1 g in dextrose 5 % 50 mL IVPB  Status:  Discontinued  1 g 100 mL/hr over 30 Minutes Intravenous 3 times per day 12/12/14 0747 12/15/14 1545   12/11/14 1930  vancomycin (VANCOCIN) IVPB 1000 mg/200 mL premix     1,000 mg 200 mL/hr over 60 Minutes Intravenous  Once 12/11/14 1924 12/11/14 2255   12/11/14 1930  ciprofloxacin (CIPRO) IVPB 400 mg     400 mg 200 mL/hr over 60 Minutes Intravenous  Once 12/11/14 1924 12/11/14 2140   12/11/14 1930  clindamycin (CLEOCIN) IVPB 600 mg     600 mg 100 mL/hr over 30 Minutes Intravenous  Once 12/11/14 1924 12/11/14 2359      Assessment: 49 yo M presents on 9/29 with chest pain. Also noted to have bilateral cellulitisand scrotal wound. Found to be septic in the ED. Now day # 4 of abx  for groin cellulitis. Afebrile, WBC now wnl. CT reveals extensive anasarca, no soft tissue gas or abscess identified of scrotum. Currently on Vancomycin and Zosyn but switching to clindamycin monotherapy. VT this am was supratherapeutic at 30 and drawn appropriately. Last Vancomycin dose was at 0900 this AM.   Bld. X 2 9/29>> 1/2 coagulase negative staph PCR MRSA 9/30 - neg Urine 9/30 > NG  Goal of Therapy:  Resolution of infection  Plan:  -Stop Vancomycin and Zosyn -Start Clindamycin 600 mg IV Q 8 hours starting tomorrow at 0600 -When ready for discharge, may switch to oral dose of clindamycin 300 mg four times daily   Vinnie Level, PharmD., BCPS Clinical Pharmacist Pager [phone]

## 2014-12-15 NOTE — Progress Notes (Signed)
[facility] PROGRESS NOTE  Anthem Frazer ZOX:[id number] DOB: 14-Nov-1965 DOA: 12/11/2014 PCP: No primary care provider on file.  Assessment/Plan: Principal Problem:   Sepsis - sepsis physiology resolved - WBC trending down   1 positive blood culture - coagulase negative species. As such most likely contaminant  Active Problems:  History of alcohol abuse - Continue on CIWA protocol - Ativan in place prn    Acute renal failure superimposed on stage 3 chronic kidney disease   Acute on chronic systolic heart failure    Hyponatremia - Most likely due to poor oral solute intake - Stable    OSA (obstructive sleep apnea)   Anasarca    Cellulitis - Place on Clindamycin   Scrotal swelling - Wound consult placed - Urology has evaluated - [place] of scrotum performed and reported improvement in scrotal wall thickening when compared to study on 11/26/14 - CT of pelvis reported no soft tissue gas or abscess   Code Status: full Family Communication: None at bedside Disposition Plan: Pending improvement in condition.   Consultants:  Urology  Procedures:  None  Antibiotics:  Aztreonam  Vancomycin  HPI/Subjective: Pt resting comfortably. No new complaints reported.  Objective: Filed Vitals:   12/15/14 1444  BP: 102/76  Pulse: 118  Temp:   Resp: 18    Intake/Output Summary (Last 24 hours) at 12/15/14 1546 Last data filed at 12/15/14 1300  Gross per 24 hour  Intake 2533.75 ml  Output   2125 ml  Net 408.75 ml   Filed Weights   12/11/14 1825 12/12/14 0532  Weight: 125.278 kg (276 lb 3 oz) 125.4 kg (276 lb 7.3 oz)    Exam:   General:  Pt resting but arrousable, in nad  Cardiovascular: no cyanosis  Respiratory: no increased wob, no wheezes  Abdomen: soft, obese, no guarding  Musculoskeletal: no cyanosis or clubbing   Data Reviewed: Basic Metabolic Panel:  Recent Labs Lab 12/11/14 1845 12/12/14 1231 12/14/14 0413  NA 129* 131* 131*  K 3.5  4.1 4.1  CL 89* 95* 94*  CO2 26 21* 24  GLUCOSE 193* 132* 119*  BUN 35* 37* 43*  CREATININE 2.32* 2.86* 2.74*  CALCIUM 8.5* 8.4* 8.0*  MG  --  1.8  --    Liver Function Tests:  Recent Labs Lab 12/11/14 1845 12/12/14 1231  AST 35 34  ALT 14* 14*  ALKPHOS 205* 196*  BILITOT 4.3* 4.1*  PROT 7.0 6.4*  ALBUMIN 2.2* 1.9*   No results for input(s): LIPASE, AMYLASE in the last 168 hours. No results for input(s): AMMONIA in the last 168 hours. CBC:  Recent Labs Lab 12/11/14 1845 12/12/14 1231 12/14/14 0413  WBC 12.5* 16.7* 9.8  NEUTROABS 11.1*  --   --   HGB 10.3* 10.4* 10.9*  HCT 35.5* 36.7* 37.7*  MCV 64.1* 67.1* 66.4*  PLT 396 331 275   Cardiac Enzymes:  Recent Labs Lab 12/11/14 1845 12/12/14 1231 12/12/14 2025 12/13/14 0043  TROPONINI 0.04* 0.08* 0.08* 0.05*   BNP (last 3 results)  Recent Labs  12/11/14 1845  BNP 1897.6*    ProBNP (last 3 results) No results for input(s): PROBNP in the last 8760 hours.  CBG:  Recent Labs Lab 12/14/14 1126 12/14/14 1613 12/14/14 2118 12/15/14 0746 12/15/14 1208  GLUCAP 149* 370* 147* 201* 187*    Recent Results (from the past 240 hour(s))  Culture, blood (routine x 2)     Status: None   Collection Time: 12/11/14  7:30 PM  Result  Value Ref Range Status   Specimen Description BLOOD RIGHT ANTECUBITAL  Final   Special Requests BOTTLES DRAWN AEROBIC AND ANAEROBIC EACH  Final   Culture  Setup Time   Final    GRAM POSITIVE COCCI IN CLUSTERS ANAEROBIC BOTTLE ONLY CRITICAL RESULT CALLED TO, READ BACK BY AND VERIFIED WITH: B REAP RN 2325 12/12/14 A BROWNING    Culture   Final    STAPHYLOCOCCUS SPECIES (COAGULASE NEGATIVE) THE SIGNIFICANCE OF ISOLATING THIS ORGANISM FROM A SINGLE SET OF BLOOD CULTURES WHEN MULTIPLE SETS ARE DRAWN IS UNCERTAIN. PLEASE NOTIFY THE MICROBIOLOGY DEPARTMENT WITHIN ONE WEEK IF SPECIATION AND SENSITIVITIES ARE REQUIRED. Performed at [facility]    Report Status 12/15/2014 FINAL   Final  Culture, blood (routine x 2)     Status: None (Preliminary result)   Collection Time: 12/11/14  8:08 PM  Result Value Ref Range Status   Specimen Description BLOOD LEFT ANTECUBITAL  Final   Special Requests BOTTLES DRAWN AEROBIC AND ANAEROBIC EACH  Final   Culture   Final    NO GROWTH 4 DAYS Performed at [facility]    Report Status PENDING  Incomplete  MRSA PCR Screening     Status: None   Collection Time: 12/12/14  5:38 AM  Result Value Ref Range Status   MRSA by PCR NEGATIVE NEGATIVE Final    Comment:        The GeneXpert MRSA Assay (FDA approved for NASAL specimens only), is one component of a comprehensive MRSA colonization surveillance program. It is not intended to diagnose MRSA infection nor to guide or monitor treatment for MRSA infections.   Urine culture     Status: None   Collection Time: 12/12/14 12:45 PM  Result Value Ref Range Status   Specimen Description URINE, CLEAN CATCH  Final   Special Requests NONE  Final   Culture NO GROWTH 1 DAY  Final   Report Status 12/13/2014 FINAL  Final     Studies: No results found.  Scheduled Meds: . collagenase   Topical Daily  . docusate sodium  100 mg Oral BID  . folic acid  1 mg Oral Daily  . heparin  5,000 Units Subcutaneous 3 times per day  . insulin aspart  0-9 Units Subcutaneous TID WC  . LORazepam  0-4 mg Oral Q12H  . multivitamin with minerals  1 tablet Oral Daily  . thiamine  100 mg Oral Daily   Or  . thiamine  100 mg Intravenous Daily   Continuous Infusions: . sodium chloride 75 mL/hr at 12/14/14 2121    Time spent: > 35 minutes    Penny Pia  [facility] Pager [phone] If 7PM-7AM, please contact night-coverage at [email or web address], password [facility] 12/15/2014, 3:46 PM  LOS: 3 days

## 2014-12-15 NOTE — Progress Notes (Signed)
Pharmacist Heart Failure Core Measure Documentation  Assessment: No ECHO has been performed  Rationale: Heart failure patients with left ventricular systolic dysfunction (LVSD) and an EF < 40% should be prescribed an angiotensin converting enzyme inhibitor (ACEI) or angiotensin receptor blocker (ARB) at discharge unless a contraindication is documented in the medical record.  This patient is not currently on an ACEI or ARB for HF.  This note is being placed in the record in order to provide documentation that a contraindication to the use of these agents is present for this encounter.  ACE Inhibitor or Angiotensin Receptor Blocker is contraindicated (specify all that apply)    ACEI allergy AND ARB allergy   Angioedema   Moderate or severe aortic stenosis   Hyperkalemia   Hypotension   Renal artery stenosis   Worsening renal function, preexisting renal disease or dysfunction   Tom Reynolds J 12/15/2014 2:04 PM

## 2014-12-15 NOTE — Progress Notes (Signed)
ANTIBIOTIC CONSULT NOTE - FOLLOW UP  Pharmacy Consult for Vancomycin / Aztreonam Indication: r/o sepsis  Allergies  Allergen Reactions  . Penicillins Other (See Comments)    intolerance    Patient Measurements: Height:  (170.2 cm) Weight: 276 lb 7.3 oz (125.4 kg) IBW/kg (Calculated) : 66.1  Vital Signs: Temp: 98.8 F (37.1 C) (10/03 0751) Temp Source: Axillary (10/03 0751) BP: 108/80 mmHg (10/03 0751) Pulse Rate: 117 (10/03 0751) Intake/Output from previous day: 10/02 0701 - 10/03 0700 In: 2688.8 [P.O.:360; I.V.:1678.8; IV Piggyback:650] Out: 2225 [Urine:2225] Intake/Output from this shift: Total I/O In: 360 [P.O.:360] Out: -   Labs:  Recent Labs  12/12/14 1231 12/14/14 0413  WBC 16.7* 9.8  HGB 10.4* 10.9*  PLT 331 275  CREATININE 2.86* 2.74*   Estimated Creatinine Clearance: 40.5 mL/min (by C-G formula based on Cr of 2.74).  Recent Labs  12/15/14 0845  VANCOTROUGH 30*     Microbiology: Recent Results (from the past 720 hour(s))  Culture, blood (routine x 2)     Status: None (Preliminary result)   Collection Time: 12/11/14  7:30 PM  Result Value Ref Range Status   Specimen Description BLOOD RIGHT ANTECUBITAL  Final   Special Requests BOTTLES DRAWN AEROBIC AND ANAEROBIC EACH  Final   Culture  Setup Time   Final    GRAM POSITIVE COCCI IN CLUSTERS ANAEROBIC BOTTLE ONLY CRITICAL RESULT CALLED TO, READ BACK BY AND VERIFIED WITH: B REAP RN 2325 12/12/14 A BROWNING    Culture   Final    GRAM POSITIVE COCCI Performed at [facility]    Report Status PENDING  Incomplete  Culture, blood (routine x 2)     Status: None (Preliminary result)   Collection Time: 12/11/14  8:08 PM  Result Value Ref Range Status   Specimen Description BLOOD LEFT ANTECUBITAL  Final   Special Requests BOTTLES DRAWN AEROBIC AND ANAEROBIC EACH  Final   Culture   Final    NO GROWTH 3 DAYS Performed at [facility]    Report Status PENDING  Incomplete   MRSA PCR Screening     Status: None   Collection Time: 12/12/14  5:38 AM  Result Value Ref Range Status   MRSA by PCR NEGATIVE NEGATIVE Final    Comment:        The GeneXpert MRSA Assay (FDA approved for NASAL specimens only), is one component of a comprehensive MRSA colonization surveillance program. It is not intended to diagnose MRSA infection nor to guide or monitor treatment for MRSA infections.   Urine culture     Status: None   Collection Time: 12/12/14 12:45 PM  Result Value Ref Range Status   Specimen Description URINE, CLEAN CATCH  Final   Special Requests NONE  Final   Culture NO GROWTH 1 DAY  Final   Report Status 12/13/2014 FINAL  Final    Anti-infectives    Start     Dose/Rate Route Frequency Ordered Stop   12/12/14 0800  vancomycin (VANCOCIN) 1,500 mg in sodium chloride 0.9 % 500 mL IVPB     1,500 mg 250 mL/hr over 120 Minutes Intravenous Every 24 hours 12/12/14 0747     12/12/14 0800  aztreonam (AZACTAM) 1 g in dextrose 5 % 50 mL IVPB     1 g 100 mL/hr over 30 Minutes Intravenous 3 times per day 12/12/14 0747     12/11/14 1930  vancomycin (VANCOCIN) IVPB 1000 mg/200 mL premix  1,000 mg 200 mL/hr over 60 Minutes Intravenous  Once 12/11/14 1924 12/11/14 2255   12/11/14 1930  ciprofloxacin (CIPRO) IVPB 400 mg     400 mg 200 mL/hr over 60 Minutes Intravenous  Once 12/11/14 1924 12/11/14 2140   12/11/14 1930  clindamycin (CLEOCIN) IVPB 600 mg     600 mg 100 mL/hr over 30 Minutes Intravenous  Once 12/11/14 1924 12/11/14 2359      Assessment: 49 yo M presents on 9/29 with chest pain. Also noted to have bilateral cellulitisand scrotal wound. Found to be septic in the ED. Now day # 4 of abx for r/o sepsis, groin cellulitis. Afebrile, WBC now wnl. CT reveals extensive anasarca, no soft tissue gas or abscess identified of scrotum. VT this am was supratherapeutic at 30 and drawn appropriately. Vancomycin dose this am was started but stopped once level came back.  Most likely received about have the dose.  Goal of Therapy:  Vancomycin trough level 15-20 mcg/ml  Resolution of infection  Plan:  Stop vancomycin today  Recheck VR tomorrow at 1200 Plan to restart vancomycin at  IV Q48 when level therapeutic Monitor clinical picture, renal function, VT at Css F/U C&S, abx deescalation / LOT  Enzo Bi, PharmD Clinical Pharmacist Pager [phone] 12/15/2014 9:34 AM

## 2014-12-15 NOTE — Progress Notes (Signed)
CRITICAL VALUE ALERT  Critical value received:  vanc trough 30  Date of notification:  12/15/2014  Time of notification:  0930  Critical value read back:Yes.    Nurse who received alert:  Kristeen Miss RN  MD notified (1st page):  Cena Benton  Time of first page:  0935  MD notified (2nd page):  Time of second page:  Responding MD:  Cena Benton  Time MD responded:  [phone]

## 2014-12-16 DIAGNOSIS — L03818 Cellulitis of other sites: Secondary | ICD-10-CM

## 2014-12-16 LAB — CULTURE, BLOOD (ROUTINE X 2): CULTURE: NO GROWTH

## 2014-12-16 LAB — BASIC METABOLIC PANEL
ANION GAP: 10 (ref 5–15)
BUN: 30 mg/dL — ABNORMAL HIGH (ref 6–20)
CHLORIDE: 101 mmol/L (ref 101–111)
CO2: 24 mmol/L (ref 22–32)
Calcium: 8.3 mg/dL — ABNORMAL LOW (ref 8.9–10.3)
Creatinine, Ser: 1.62 mg/dL — ABNORMAL HIGH (ref 0.61–1.24)
GFR calc non Af Amer: 48 mL/min — ABNORMAL LOW (ref >60)
GFR, EST AFRICAN AMERICAN: 55 mL/min — AB (ref >60)
GLUCOSE: 131 mg/dL — AB (ref 65–99)
POTASSIUM: 4.3 mmol/L (ref 3.5–5.1)
Sodium: 135 mmol/L (ref 135–145)

## 2014-12-16 LAB — VITAMIN B12: VITAMIN B 12: 1800 pg/mL — AB (ref 180–914)

## 2014-12-16 LAB — GLUCOSE, CAPILLARY
GLUCOSE-CAPILLARY: 167 mg/dL — AB (ref 65–99)
GLUCOSE-CAPILLARY: 115 mg/dL — AB (ref 65–99)
Glucose-Capillary: 132 mg/dL — ABNORMAL HIGH (ref 65–99)
Glucose-Capillary: 113 mg/dL — ABNORMAL HIGH (ref 65–99)

## 2014-12-16 LAB — AMMONIA: AMMONIA: 37 umol/L — AB (ref 9–35)

## 2014-12-16 LAB — FOLATE: FOLATE: 17.1 ng/mL (ref >5.9)

## 2014-12-16 NOTE — Progress Notes (Signed)
[facility] PROGRESS NOTE  Tom Reynolds XBJ:[id number] DOB: [birth year] DOA: 12/11/2014 PCP: No primary care provider on file.  Brief Narrative: - Patient is a 49 year old Caucasian male with history of alcohol abuse, OSA, stage III CKD, who presented with localized infection and scrotum which has been evaluated by urology and altered mental status. Hospital stay has been complicated by confusion and patient required IVC paperwork  Assessment/Plan: Principal Problem:   Sepsis - sepsis physiology resolved - WBC trending down - Transitioned to clindamycin and currently doing better   1 positive blood culture - coagulase negative species. As such most likely contaminant  Active Problems:  Altered mental status - Most likely due to combination of active infection and history of alcohol use. Will obtain further workup today to assess for other causes  History of alcohol abuse - Continue on CIWA protocol - Ativan in place prn    Acute renal failure superimposed on stage 3 chronic kidney disease   Acute on chronic systolic heart failure    Hyponatremia - Most likely due to poor oral solute intake - Stable    OSA (obstructive sleep apnea)   Anasarca    Cellulitis - Place on Clindamycin   Scrotal swelling - Wound consult placed - Urology has evaluated - [place] of scrotum performed and reported improvement in scrotal wall thickening when compared to study on 11/26/14 - CT of pelvis reported no soft tissue gas or abscess   Code Status: full Family Communication: None at bedside Disposition Plan: With improvement in mentation may consider transitioning to SNF   Consultants:  Urology  Procedures:  None  Antibiotics:  Clindamycin  HPI/Subjective: Pt resting comfortably. No new complaints reported. Still having periods of agitation  Objective: Filed Vitals:   12/16/14 1141  BP: 106/77  Pulse: 111  Temp: 97.4 F (36.3 C)  Resp: 18    Intake/Output Summary  (Last 24 hours) at 12/16/14 1538 Last data filed at 12/16/14 0813  Gross per 24 hour  Intake   1250 ml  Output   1100 ml  Net    150 ml   Filed Weights   12/11/14 1825 12/12/14 0532  Weight: 125.278 kg (276 lb 3 oz) 125.4 kg (276 lb 7.3 oz)    Exam:   General:  Pt resting but arrousable, in nad  Cardiovascular: no cyanosis  Respiratory: no increased wob, no wheezes  Abdomen: soft, obese, no guarding  Musculoskeletal: no cyanosis or clubbing   Data Reviewed: Basic Metabolic Panel:  Recent Labs Lab 12/11/14 1845 12/12/14 1231 12/14/14 0413 12/16/14 0417  NA 129* 131* 131* 135  K 3.5 4.1 4.1 4.3  CL 89* 95* 94* 101  CO2 26 21* 24 24  GLUCOSE 193* 132* 119* 131*  BUN 35* 37* 43* 30*  CREATININE 2.32* 2.86* 2.74* 1.62*  CALCIUM 8.5* 8.4* 8.0* 8.3*  MG  --  1.8  --   --    Liver Function Tests:  Recent Labs Lab 12/11/14 1845 12/12/14 1231  AST 35 34  ALT 14* 14*  ALKPHOS 205* 196*  BILITOT 4.3* 4.1*  PROT 7.0 6.4*  ALBUMIN 2.2* 1.9*   No results for input(s): LIPASE, AMYLASE in the last 168 hours. No results for input(s): AMMONIA in the last 168 hours. CBC:  Recent Labs Lab 12/11/14 1845 12/12/14 1231 12/14/14 0413  WBC 12.5* 16.7* 9.8  NEUTROABS 11.1*  --   --   HGB 10.3* 10.4* 10.9*  HCT 35.5* 36.7* 37.7*  MCV 64.1* 67.1* 66.4*  PLT 396 331 275   Cardiac Enzymes:  Recent Labs Lab 12/11/14 1845 12/12/14 1231 12/12/14 2025 12/13/14 0043  TROPONINI 0.04* 0.08* 0.08* 0.05*   BNP (last 3 results)  Recent Labs  12/11/14 1845  BNP 1897.6*    ProBNP (last 3 results) No results for input(s): PROBNP in the last 8760 hours.  CBG:  Recent Labs Lab 12/15/14 1208 12/15/14 1659 12/15/14 2215 12/16/14 0811 12/16/14 1147  GLUCAP 187* 147* 124* 132* 167*    Recent Results (from the past 240 hour(s))  Culture, blood (routine x 2)     Status: None   Collection Time: 12/11/14  7:30 PM  Result Value Ref Range Status   Specimen  Description BLOOD RIGHT ANTECUBITAL  Final   Special Requests BOTTLES DRAWN AEROBIC AND ANAEROBIC EACH  Final   Culture  Setup Time   Final    GRAM POSITIVE COCCI IN CLUSTERS ANAEROBIC BOTTLE ONLY CRITICAL RESULT CALLED TO, READ BACK BY AND VERIFIED WITH: B REAP RN 2325 12/12/14 A BROWNING    Culture   Final    STAPHYLOCOCCUS SPECIES (COAGULASE NEGATIVE) THE SIGNIFICANCE OF ISOLATING THIS ORGANISM FROM A SINGLE SET OF BLOOD CULTURES WHEN MULTIPLE SETS ARE DRAWN IS UNCERTAIN. PLEASE NOTIFY THE MICROBIOLOGY DEPARTMENT WITHIN ONE WEEK IF SPECIATION AND SENSITIVITIES ARE REQUIRED. Performed at [facility]    Report Status 12/15/2014 FINAL  Final  Culture, blood (routine x 2)     Status: None   Collection Time: 12/11/14  8:08 PM  Result Value Ref Range Status   Specimen Description BLOOD LEFT ANTECUBITAL  Final   Special Requests BOTTLES DRAWN AEROBIC AND ANAEROBIC EACH  Final   Culture   Final    NO GROWTH 5 DAYS Performed at [facility]    Report Status 12/16/2014 FINAL  Final  MRSA PCR Screening     Status: None   Collection Time: 12/12/14  5:38 AM  Result Value Ref Range Status   MRSA by PCR NEGATIVE NEGATIVE Final    Comment:        The GeneXpert MRSA Assay (FDA approved for NASAL specimens only), is one component of a comprehensive MRSA colonization surveillance program. It is not intended to diagnose MRSA infection nor to guide or monitor treatment for MRSA infections.   Urine culture     Status: None   Collection Time: 12/12/14 12:45 PM  Result Value Ref Range Status   Specimen Description URINE, CLEAN CATCH  Final   Special Requests NONE  Final   Culture NO GROWTH 1 DAY  Final   Report Status 12/13/2014 FINAL  Final     Studies: No results found.  Scheduled Meds: . clindamycin (CLEOCIN) IV  600 mg Intravenous 3 times per day  . collagenase   Topical Daily  . docusate sodium  100 mg Oral BID  . folic acid  1 mg Oral Daily  . heparin   5,000 Units Subcutaneous 3 times per day  . insulin aspart  0-9 Units Subcutaneous TID WC  . LORazepam  0-4 mg Oral Q12H  . multivitamin with minerals  1 tablet Oral Daily  . thiamine  100 mg Oral Daily   Or  . thiamine  100 mg Intravenous Daily   Continuous Infusions: . sodium chloride 75 mL/hr at 12/16/14 0241    Time spent: > 35 minutes    Penny Pia  [facility] Pager [phone] If 7PM-7AM, please contact night-coverage at [email or web address], password [facility] 12/16/2014, 3:38 PM  LOS: 4 days

## 2014-12-16 NOTE — Progress Notes (Signed)
Bed bath and perineal care given. Clean linens provided

## 2014-12-17 DIAGNOSIS — R21 Rash and other nonspecific skin eruption: Secondary | ICD-10-CM | POA: Diagnosis not present

## 2014-12-17 DIAGNOSIS — G9340 Encephalopathy, unspecified: Secondary | ICD-10-CM

## 2014-12-17 LAB — BASIC METABOLIC PANEL
Anion gap: 14 (ref 5–15)
BUN: 31 mg/dL — AB (ref 6–20)
CALCIUM: 8.7 mg/dL — AB (ref 8.9–10.3)
CO2: 23 mmol/L (ref 22–32)
Chloride: 102 mmol/L (ref 101–111)
Creatinine, Ser: 1.49 mg/dL — ABNORMAL HIGH (ref 0.61–1.24)
GFR calc Af Amer: >60 mL/min (ref >60)
GFR, EST NON AFRICAN AMERICAN: 53 mL/min — AB (ref >60)
GLUCOSE: 106 mg/dL — AB (ref 65–99)
Potassium: 4.7 mmol/L (ref 3.5–5.1)
Sodium: 139 mmol/L (ref 135–145)

## 2014-12-17 LAB — GLUCOSE, CAPILLARY
GLUCOSE-CAPILLARY: 156 mg/dL — AB (ref 65–99)
GLUCOSE-CAPILLARY: 152 mg/dL — AB (ref 65–99)
Glucose-Capillary: 120 mg/dL — ABNORMAL HIGH (ref 65–99)
Glucose-Capillary: 137 mg/dL — ABNORMAL HIGH (ref 65–99)

## 2014-12-17 LAB — CBC
HCT: 41.2 % (ref 39.0–52.0)
Hemoglobin: 11.5 g/dL — ABNORMAL LOW (ref 13.0–17.0)
MCH: 19.4 pg — AB (ref 26.0–34.0)
MCHC: 27.9 g/dL — ABNORMAL LOW (ref 30.0–36.0)
MCV: 69.5 fL — AB (ref 78.0–100.0)
PLATELETS: 295 10*3/uL (ref 150–400)
RBC: 5.93 MIL/uL — ABNORMAL HIGH (ref 4.22–5.81)
RDW: 22.1 % — AB (ref 11.5–15.5)
WBC: 10.3 10*3/uL (ref 4.0–10.5)

## 2014-12-17 LAB — MAGNESIUM: Magnesium: 2.1 mg/dL (ref 1.7–2.4)

## 2014-12-17 LAB — HIV ANTIBODY (ROUTINE TESTING W REFLEX): HIV SCREEN 4TH GENERATION: NONREACTIVE

## 2014-12-17 LAB — RPR: RPR Ser Ql: NONREACTIVE

## 2014-12-17 MED ORDER — DIAZEPAM 5 MG/ML IJ SOLN
5.0000 mg | INTRAMUSCULAR | Status: DC | PRN
  Administered 2014-12-17 – 2014-12-18 (×3): 5 mg via INTRAVENOUS
  Administered 2014-12-18: 10 mg via INTRAVENOUS
  Administered 2014-12-18 – 2014-12-23 (×12): 5 mg via INTRAVENOUS
  Administered 2014-12-24 – 2014-12-26 (×2): 10 mg via INTRAVENOUS
  Administered 2014-12-27: 5 mg via INTRAVENOUS
  Administered 2014-12-28: 10 mg via INTRAVENOUS
  Filled 2014-12-17 (×22): qty 2

## 2014-12-17 MED ORDER — FAMOTIDINE 20 MG PO TABS
20.0000 mg | ORAL_TABLET | Freq: Two times a day (BID) | ORAL | Status: DC
  Administered 2014-12-17 – 2014-12-28 (×23): 20 mg via ORAL
  Filled 2014-12-17 (×24): qty 1

## 2014-12-17 MED ORDER — DEXTROSE 5 % IV SOLN
1.0000 g | Freq: Three times a day (TID) | INTRAVENOUS | Status: DC
  Administered 2014-12-17 – 2014-12-23 (×20): 1 g via INTRAVENOUS
  Filled 2014-12-17 (×23): qty 1

## 2014-12-17 MED ORDER — SODIUM CHLORIDE 0.9 % IJ SOLN: 3.0000 mL | INTRAMUSCULAR | Status: DC | PRN

## 2014-12-17 MED ORDER — SODIUM CHLORIDE 0.9 % IV SOLN: 250.0000 mL | INTRAVENOUS | Status: DC | PRN

## 2014-12-17 MED ORDER — SODIUM CHLORIDE 0.9 % IJ SOLN
3.0000 mL | Freq: Two times a day (BID) | INTRAMUSCULAR | Status: DC
  Administered 2014-12-17 (×2): 3 mL via INTRAVENOUS

## 2014-12-17 MED ORDER — MORPHINE SULFATE (PF) 2 MG/ML IV SOLN
2.0000 mg | INTRAVENOUS | Status: DC | PRN
  Administered 2014-12-17 – 2014-12-23 (×12): 2 mg via INTRAVENOUS
  Filled 2014-12-17 (×14): qty 1

## 2014-12-17 NOTE — Progress Notes (Signed)
[facility] PROGRESS NOTE  Tom Reynolds ZOX:[id number] DOB: Mar 04, 1966 DOA: 12/11/2014 PCP: No primary care provider on file.  Assessment/Plan: #1 sepsis Patient was admitted with sepsis felt to be secondary to bilateral lower extremity cellulitis, scrotal cellulitis. On admission patient had elevated lactic acid level of 3.83 and was in acute on chronic renal failure with a creatinine going up as high as 2.32 on admission from a baseline of 1.26. Patient was pancultured. Urine cultures were negative. Blood cultures with 1/2 coag neg. staph likely a contaminant. Patient has been seen in consultation by urology recommend antibiotic and wound care treatment with outpatient follow-up. Patient was transitioned to IV clindamycin yesterday however developed a rash and subsequently has been transitioned back to IV Azactam. Continue Azactam for now. KVO IV fluids. Follow.  #2 scrotal swelling and cellulitis/ LE cellulitis Patient currently afebrile. WBC has trended down. Patient still with significant scrotal swelling. Patient has been seen in consultation by urology will feel the etiology of patient's swelling and cellulitis is secondary to poor hygiene. CT pelvis was negative for soft tissue gas or abscess. Patient status post Foley catheter placement with good output. KVO IV fluids. Patient was initially on IV vancomycin IV Azactam and subsequently transitioned to IV clindamycin however developed a rationale to place back on IV Azactam. Scrotal elevation. Continue wound care dressings per wound care nurse and recommendations. Will likely need follow-up with urology as outpatient.  #3 acute encephalopathy Likely multifactorial secondary to alcohol withdrawal and current infection. Patient noted to be more agitated on Ativan per nursing and requiring bedside sitter. Will discontinue Dilaudid. Will place patient on alcohol withdrawal protocol with Valium. Check a EKG for QTC. If QTC is normal may need  Haldol as needed. If no improvement on Valium and still very agitated and withdrawn may need to consult with critical care to place patient on a Precedex drip.  #4 acute renal failure chronic kidney disease stage II Baseline renal function is 1.26. On admission patient was up to 2.32. Patient however also noted to be volume loaded oh on examination. Renal function has improved with gentle hydration. KVO IV fluids. Follow urine output. Continue to hold nephrotoxic agents. May need diuresis will follow for now.  #5 hyponatremia Resolved.  #6 anasarca Questionable etiology. Patient with no respiratory symptoms however does have bilateral lower extremity edema and lower abdominal edema as well as scrotal edema. KVO IV fluids. Follow I/0. Follow.  #7 ETOH abuse Patient likely going through withdrawal which agitation. Per nursing no significant improvement with Ativan. Will change Ativan to Valium for the alcohol withdrawal protocol. Thiamine. Folic acid. Follow.  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 We'll need to hold off on ACE inhibitor for now secondary to problem #4. Likely might need diuresis. Patient with 1.1 L urine output yesterday. Follow for now.  #9 gastroesophageal reflux disease Pepcid.  #10 prophylaxis Pepcid for GI prophylaxis. Heparin for DVT prophylaxis.   Code Status: Full Family Communication: No family at bedside. Disposition Plan: Remain the step down unit   Consultants:  Urology: Dr. Neva Seat 12/12/2014  Procedures:  CT pelvis 12/12/2014  Chest x-ray 12/11/2014    Antibiotics:  IV ciprofloxacin 12/11/2014>>> ninth 29 2016  IV clindamycin 12/11/2014>>> 12/11/2014  IV clindamycin 12/16/2014>>>> 12/17/2014  IV Azactam 12/12/2014>>> 12/15/2014  IV Azactam 12/17/2014  IV vancomycin 12/11/2014>>>> 12/15/2014  HPI/Subjective: Patient  resting. Patient noted to have received diludid.  Per nursing patient very agitated aggressive, confused, abusive to staff.  Objective: Filed Vitals:   12/17/14 0800  BP: 102/73  Pulse: 105  Temp: 97.3 F (36.3 C)  Resp: 11    Intake/Output Summary (Last 24 hours) at 12/17/14 1005 Last data filed at 12/17/14 0700  Gross per 24 hour  Intake   1195 ml  Output    975 ml  Net    220 ml   Filed Weights   12/11/14 1825 12/12/14 0532  Weight: 125.278 kg (276 lb 3 oz) 125.4 kg (276 lb 7.3 oz)    Exam:   General:  Sleeping  Cardiovascular: RRR with 3/6 SEM  Respiratory: CTAB anterior lung fields  Abdomen: Soft, slight distention, positive bowel sounds, no rebound, no guarding. Rash noted on the back in the lower torso. Chronic scrotal wound with eschar. Scrotal swelling  Musculoskeletal: No clubbing or cyanosis. 2+ bilateral lower extremity edema. Right lower extremity with decreased erythema, wounds ( 2.5 x 2.5 , and 1.5 x 1.5cm) with sloughed/eschar minimal amount of yellow drainage noted on the right lower extremity.  Data Reviewed: Basic Metabolic Panel:  Recent Labs Lab 12/11/14 1845 12/12/14 1231 12/14/14 0413 12/16/14 0417 12/17/14 0821  NA 129* 131* 131* 135 139  K 3.5 4.1 4.1 4.3 4.7  CL 89* 95* 94* 101 102  CO2 26 21* GLUCOSE 193* 132* 119* 131* 106*  BUN 35* 37* 43* 30* 31*  CREATININE 2.32* 2.86* 2.74* 1.62* 1.49*  CALCIUM 8.5* 8.4* 8.0* 8.3* 8.7*  MG  --  1.8  --   --  2.1   Liver Function Tests:  Recent Labs Lab 12/11/14 1845 12/12/14 1231  AST 35 34  ALT 14* 14*  ALKPHOS 205* 196*  BILITOT 4.3* 4.1*  PROT 7.0 6.4*  ALBUMIN 2.2* 1.9*   No results for input(s): LIPASE, AMYLASE in the last 168 hours.  Recent Labs Lab 12/16/14 1644  AMMONIA 37*   CBC:  Recent Labs Lab 12/11/14 1845 12/12/14 1231 12/14/14 0413 12/17/14 0821  WBC 12.5* 16.7* 9.8 10.3  NEUTROABS 11.1*  --   --   --   HGB 10.3* 10.4* 10.9* 11.5*  HCT 35.5*  36.7* 37.7* 41.2  MCV 64.1* 67.1* 66.4* 69.5*  PLT 396 331 275 295   Cardiac Enzymes:  Recent Labs Lab 12/11/14 1845 12/12/14 1231 12/12/14 2025 12/13/14 0043  TROPONINI 0.04* 0.08* 0.08* 0.05*   BNP (last 3 results)  Recent Labs  12/11/14 1845  BNP 1897.6*    ProBNP (last 3 results) No results for input(s): PROBNP in the last 8760 hours.  CBG:  Recent Labs Lab 12/16/14 0811 12/16/14 1147 12/16/14 1558 12/16/14 2217 12/17/14 0818  GLUCAP 132* 167* 115* 113* 120*    Recent Results (from the past 240 hour(s))  Culture, blood (routine x 2)     Status: None   Collection Time: 12/11/14  7:30 PM  Result Value Ref Range Status   Specimen Description BLOOD RIGHT ANTECUBITAL  Final   Special Requests BOTTLES DRAWN AEROBIC AND ANAEROBIC EACH  Final   Culture  Setup Time   Final    GRAM POSITIVE COCCI IN CLUSTERS ANAEROBIC BOTTLE ONLY CRITICAL RESULT CALLED TO, READ BACK BY AND VERIFIED WITH: B REAP RN 2325 12/12/14 A BROWNING    Culture   Final    STAPHYLOCOCCUS SPECIES (COAGULASE NEGATIVE) THE SIGNIFICANCE OF ISOLATING THIS ORGANISM FROM A SINGLE SET OF BLOOD CULTURES WHEN MULTIPLE SETS ARE DRAWN IS UNCERTAIN.  PLEASE NOTIFY THE MICROBIOLOGY DEPARTMENT WITHIN ONE WEEK IF SPECIATION AND SENSITIVITIES ARE REQUIRED. Performed at [facility]    Report Status 12/15/2014 FINAL  Final  Culture, blood (routine x 2)     Status: None   Collection Time: 12/11/14  8:08 PM  Result Value Ref Range Status   Specimen Description BLOOD LEFT ANTECUBITAL  Final   Special Requests BOTTLES DRAWN AEROBIC AND ANAEROBIC EACH  Final   Culture   Final    NO GROWTH 5 DAYS Performed at [facility]    Report Status 12/16/2014 FINAL  Final  MRSA PCR Screening     Status: None   Collection Time: 12/12/14  5:38 AM  Result Value Ref Range Status   MRSA by PCR NEGATIVE NEGATIVE Final    Comment:        The GeneXpert MRSA Assay (FDA approved for NASAL specimens only),  is one component of a comprehensive MRSA colonization surveillance program. It is not intended to diagnose MRSA infection nor to guide or monitor treatment for MRSA infections.   Urine culture     Status: None   Collection Time: 12/12/14 12:45 PM  Result Value Ref Range Status   Specimen Description URINE, CLEAN CATCH  Final   Special Requests NONE  Final   Culture NO GROWTH 1 DAY  Final   Report Status 12/13/2014 FINAL  Final     Studies: No results found.  Scheduled Meds: . aztreonam  1 g Intravenous 3 times per day  . collagenase   Topical Daily  . docusate sodium  100 mg Oral BID  . folic acid  1 mg Oral Daily  . heparin  5,000 Units Subcutaneous 3 times per day  . insulin aspart  0-9 Units Subcutaneous TID WC  . LORazepam  0-4 mg Oral Q12H  . multivitamin with minerals  1 tablet Oral Daily  . thiamine  100 mg Oral Daily   Or  . thiamine  100 mg Intravenous Daily   Continuous Infusions: . sodium chloride 50 mL/hr at 12/17/14 1610    Principal Problem:   Sepsis (HCC) Active Problems:   Scrotal swelling   Acute encephalopathy   Acute renal failure superimposed on stage 3 chronic kidney disease (HCC)   Acute on chronic systolic heart failure (HCC)   Hyponatremia   OSA (obstructive sleep apnea)   Cellulitis of leg, right   Anasarca   Cellulitis   Blood poisoning (HCC)   Rash and nonspecific skin eruption    Time spent: 40 mins    [facility] MD [facility] Pager [phone]. If 7PM-7AM, please contact night-coverage at [email or web address], password [facility] 12/17/2014, 10:05 AM  LOS: 5 days

## 2014-12-17 NOTE — Progress Notes (Signed)
While bathing pt at 0200  I noticed a  bright red, angy, raised, weeping rash from pt's midback spreading down bilateral buttocks  and upper outer legs.  Tama Gander notified of same.  Cleocin dc'd and pt started on Azactam.  Will continue to monitor pt for extension of rash.

## 2014-12-18 LAB — BASIC METABOLIC PANEL
Anion gap: 13 (ref 5–15)
BUN: 27 mg/dL — AB (ref 6–20)
CALCIUM: 8.5 mg/dL — AB (ref 8.9–10.3)
CO2: 25 mmol/L (ref 22–32)
CREATININE: 1.37 mg/dL — AB (ref 0.61–1.24)
Chloride: 98 mmol/L — ABNORMAL LOW (ref 101–111)
GFR calc Af Amer: >60 mL/min (ref >60)
GFR, EST NON AFRICAN AMERICAN: 58 mL/min — AB (ref >60)
GLUCOSE: 148 mg/dL — AB (ref 65–99)
Potassium: 4.5 mmol/L (ref 3.5–5.1)
SODIUM: 136 mmol/L (ref 135–145)

## 2014-12-18 LAB — CBC
HCT: 38.2 % — ABNORMAL LOW (ref 39.0–52.0)
Hemoglobin: 10.8 g/dL — ABNORMAL LOW (ref 13.0–17.0)
MCH: 19 pg — AB (ref 26.0–34.0)
MCHC: 28.3 g/dL — AB (ref 30.0–36.0)
MCV: 67.4 fL — ABNORMAL LOW (ref 78.0–100.0)
PLATELETS: 291 10*3/uL (ref 150–400)
RBC: 5.67 MIL/uL (ref 4.22–5.81)
RDW: 21.8 % — AB (ref 11.5–15.5)
WBC: 11.1 10*3/uL — ABNORMAL HIGH (ref 4.0–10.5)

## 2014-12-18 LAB — GLUCOSE, CAPILLARY
Glucose-Capillary: 147 mg/dL — ABNORMAL HIGH (ref 65–99)
Glucose-Capillary: 141 mg/dL — ABNORMAL HIGH (ref 65–99)
Glucose-Capillary: 180 mg/dL — ABNORMAL HIGH (ref 65–99)
Glucose-Capillary: 144 mg/dL — ABNORMAL HIGH (ref 65–99)

## 2014-12-18 LAB — MAGNESIUM: MAGNESIUM: 2.1 mg/dL (ref 1.7–2.4)

## 2014-12-18 MED ORDER — HALOPERIDOL LACTATE 5 MG/ML IJ SOLN
5.0000 mg | Freq: Four times a day (QID) | INTRAMUSCULAR | Status: DC | PRN
  Administered 2014-12-18 – 2014-12-19 (×2): 5 mg via INTRAVENOUS
  Filled 2014-12-18 (×3): qty 1

## 2014-12-18 NOTE — Clinical Social Work Note (Signed)
Clinical Social Work Assessment  Patient Details  Name: Tom Reynolds MRN: [id number] Date of Birth: 06/07/1965  Date of referral:  12/18/14               Reason for consult:  Substance Use/ETOH Abuse, Housing Concerns/Homelessness, Family Concerns                Permission sought to share information with:  Family Supports Permission granted to share information::   (pt disoriented)  Name::     Radiation protection practitioner::     Relationship::  sister, Armed forces technical officer Information:     Housing/Transportation Living arrangements for the past 2 months:  Single Family Home Source of Information:   (siblings) Patient Interpreter Needed:  None Criminal Activity/Legal Involvement Pertinent to Current Situation/Hospitalization:  No - Comment as needed Significant Relationships:  Siblings Lives with:  Roommate Do you feel safe going back to the place where you live?    Need for family participation in patient care:     Care giving concerns:  Pt has been living with a elderly man and providing some caregiving for him- according to pt sister the pt and elderly man live in filthy conditions because pt is also incapable of caring properly for himself   Office manager / plan:  CSW spoke with pt sisters Clydie Braun and Darl Pikes) regarding patient disposition when ready to leave the hospital  Employment status:  Unemployed Insurance information:  Self Pay (Medicaid Pending) (finanical counseling has seen for Medicaid) PT Recommendations:  Not assessed at this time Information / Referral to community resources:  Skilled Nursing Facility, Outpatient Substance Abuse Treatment Options, SSI, Food Stamps  Patient/Family's Response to care:  Pt sisters are concerned about pt living/financial situation.  CSW discussed pt living condition and encouraged pt family to follow up if pt can return to living with his friend Theodoro Grist)- if pt is unable to go back to living with Delfin Gant has stated she would take him in if  there was no where else to go.  CSW discussed possibility of SNF if pt requiring continuing medical interventions but family does not think that pt will consent to a facility.  Clydie Braun also discussed financial concerns- Clydie Braun is working with [employer] financial counseling for [employer] and disability eligibility.  Clydie Braun stated that pt has court date on 12/24/14 for a traffic violation- CSW faxed letter explaining pt current hospitilization to [employer] attorney  CSW explained that further steps can not be taken while pt is disoriented and that he will have the final say in his discharge planning- they express understanding   Patient/Family's Understanding of and Emotional Response to Diagnosis, Current Treatment, and Prognosis:  They are concerned about pt mental status and think that some permanent damage has been done- they express a desire to learn more about his current condition because they do not trust the pt report of his condition  Emotional Assessment Appearance:  Appears stated age, Disheveled Attitude/Demeanor/Rapport:  Aggressive (Verbally and/or physically), Combative Affect (typically observed):  Agitated Orientation:  Fluctuating Orientation (Suspected and/or reported Sundowners) (suspected substance abuse withdrawal (alcohol, cocaine)) Alcohol / Substance use:  Illicit Drugs, Alcohol Use Psych involvement (Current and /or in the community):  No (Comment)  Discharge Needs  Concerns to be addressed:  Basic Needs, Compliance Issues Concerns, Discharge Planning Concerns, Home Safety Concerns, Mental Health Concerns, Substance Abuse Concerns, Decision making concerns, Medication Concerns Readmission within the last 30 days:  No Current discharge risk:  Inadequate Financial Supports,  Cognitively Impaired Barriers to Discharge:  Requiring sitter/restraints, Continued Medical Work up   [employer], LCSW 12/18/2014, 1:41 PM

## 2014-12-18 NOTE — Progress Notes (Signed)
[facility] PROGRESS NOTE  Tom Reynolds ZOX:[id number] DOB: [birth year] DOA: 12/11/2014 PCP: No primary care provider on file.  Assessment/Plan: #1 sepsis Patient was admitted with sepsis felt to be secondary to bilateral lower extremity cellulitis, scrotal cellulitis. On admission patient had elevated lactic acid level of 3.83 and was in acute on chronic renal failure with a creatinine going up as high as 2.32 on admission from a baseline of 1.26. Patient was pancultured. Urine cultures were negative. Blood cultures with 1/2 coag neg. staph likely a contaminant. Patient has been seen in consultation by urology recommend antibiotic and wound care treatment with outpatient follow-up. Patient was transitioned to IV clindamycin, however developed a rash and subsequently has been transitioned back to IV Azactam. Continue Azactam for now. Saline lock IV fluids. Follow.  #2 scrotal swelling and cellulitis/ LE cellulitis Patient currently afebrile. WBC has trended down. Patient still with significant scrotal swelling. Patient has been seen in consultation by urology will feel the etiology of patient's swelling and cellulitis is secondary to poor hygiene. CT pelvis was negative for soft tissue gas or abscess. Patient status post Foley catheter placement with good output. KVO IV fluids. Patient was initially on IV vancomycin IV Azactam and subsequently transitioned to IV clindamycin however developed a rash to his back, and now on IV Azactam. Scrotal elevation. Continue wound care dressings per wound care nurse and recommendations. Will likely need follow-up with urology as outpatient.  #3 acute encephalopathy Likely multifactorial secondary to alcohol withdrawal and current infection. Patient less agitated today after changing Ativan to Valium for the withdrawal protocol. Dilaudid has been discontinued. QTC is normal, may need Haldol as needed. If no improvement on Valium and still very agitated and  withdrawal may need to consult with critical care to place patient on a Precedex drip.  #4 acute renal failure chronic kidney disease stage II Baseline renal function is 1.26. On admission patient was up to 2.32. Creatinine currently at 1.37. Patient however also noted to be volume loaded on examination. Renal function has improved with gentle hydration. NSL IV fluids. Follow urine output. Continue to hold nephrotoxic agents. May need diuresis however blood pressure is borderline. Patient with a urine output of 1.175 L over the past 24 hours. Follow for now.  #5 hyponatremia Resolved.  #6 anasarca Questionable etiology. Patient with no respiratory symptoms however does have bilateral lower extremity edema and lower abdominal edema as well as scrotal edema. Saline lock IVF. Patient with borderline blood pressure and a such unable to diuresed with IV Lasix. Patient seems to be auto diuresing and had a urine output of 1.175L yesterday. Follow. KVO IV fluids. Follow I/0. Follow.  #7 ETOH abuse Patient likely going through withdrawal with agitation. Per nursing improvement with agitation with change from ativan to valium on withdrawal protocol. Thiamine. Folic acid. Follow.  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 We'll need to hold off on ACE inhibitor for now secondary to problem #4. Likely might need diuresis. Patient with 1.175 L urine output yesterday. Follow for now.  #9 gastroesophageal reflux disease Pepcid.  #10 prophylaxis Pepcid for GI prophylaxis. Heparin for DVT prophylaxis.   Code Status: Full Family Communication: No family at bedside. Disposition Plan: Remain the step down unit   Consultants:  Urology: Dr. Neva Seat 12/12/2014  Procedures:  CT pelvis 12/12/2014  Chest x-ray 12/11/2014    Antibiotics:  IV ciprofloxacin 12/11/2014>>> ninth 29  2016  IV  clindamycin 12/11/2014>>> 12/11/2014  IV clindamycin 12/16/2014>>>> 12/17/2014  IV Azactam 12/12/2014>>> 12/15/2014  IV Azactam 12/17/2014  IV vancomycin 12/11/2014>>>> 12/15/2014  HPI/Subjective: Patient alert. Less agitated. Following commands.  Objective: Filed Vitals:   12/18/14 0804  BP: 109/84  Pulse: 115  Temp: 97.6 F (36.4 C)  Resp:     Intake/Output Summary (Last 24 hours) at 12/18/14 0857 Last data filed at 12/18/14 0807  Gross per 24 hour  Intake    753 ml  Output   1375 ml  Net   -622 ml   Filed Weights   12/11/14 1825 12/12/14 0532  Weight: 125.278 kg (276 lb 3 oz) 125.4 kg (276 lb 7.3 oz)    Exam:   General:  Alert  Cardiovascular: RRR with 3/6 SEM  Respiratory: CTAB anterior lung fields  Abdomen: Soft, slight distention, positive bowel sounds, no rebound, no guarding. Rash noted on the back in the lower torso improving. Chronic scrotal wound with eschar. Scrotal swelling  Musculoskeletal: No clubbing or cyanosis. 2+ bilateral lower extremity edema. Right lower extremity with decreased erythema, wounds ( 2.5 x 2.5 , and 1.5 x 1.5cm) with sloughed/eschar minimal amount of yellow drainage noted on the right lower extremity.  Data Reviewed: Basic Metabolic Panel:  Recent Labs Lab 12/12/14 1231 12/14/14 0413 12/16/14 0417 12/17/14 0821 12/18/14 0425  NA 131* 131* 135 139 136  K 4.1 4.1 4.3 4.7 4.5  CL 95* 94* 101 102 98*  CO2 21* 24 24 23 25   GLUCOSE 132* 119* 131* 106* 148*  BUN 37* 43* 30* 31* 27*  CREATININE 2.86* 2.74* 1.62* 1.49* 1.37*  CALCIUM 8.4* 8.0* 8.3* 8.7* 8.5*  MG 1.8  --   --  2.1 2.1   Liver Function Tests:  Recent Labs Lab 12/11/14 1845 12/12/14 1231  AST 35 34  ALT 14* 14*  ALKPHOS 205* 196*  BILITOT 4.3* 4.1*  PROT 7.0 6.4*  ALBUMIN 2.2* 1.9*   No results for input(s): LIPASE, AMYLASE in the last 168 hours.  Recent Labs Lab 12/16/14 1644  AMMONIA 37*   CBC:  Recent Labs Lab 12/11/14 1845  12/12/14 1231 12/14/14 0413 12/17/14 0821 12/18/14 0425  WBC 12.5* 16.7* 9.8 10.3 11.1*  NEUTROABS 11.1*  --   --   --   --   HGB 10.3* 10.4* 10.9* 11.5* 10.8*  HCT 35.5* 36.7* 37.7* 41.2 38.2*  MCV 64.1* 67.1* 66.4* 69.5* 67.4*  PLT 396 331 275 295 291   Cardiac Enzymes:  Recent Labs Lab 12/11/14 1845 12/12/14 1231 12/12/14 2025 12/13/14 0043  TROPONINI 0.04* 0.08* 0.08* 0.05*   BNP (last 3 results)  Recent Labs  12/11/14 1845  BNP 1897.6*    ProBNP (last 3 results) No results for input(s): PROBNP in the last 8760 hours.  CBG:  Recent Labs Lab 12/17/14 0818 12/17/14 1230 12/17/14 1643 12/17/14 2143 12/18/14 0803  GLUCAP 120* 156* 152* 137* 147*    Recent Results (from the past 240 hour(s))  Culture, blood (routine x 2)     Status: None   Collection Time: 12/11/14  7:30 PM  Result Value Ref Range Status   Specimen Description BLOOD RIGHT ANTECUBITAL  Final   Special Requests BOTTLES DRAWN AEROBIC AND ANAEROBIC EACH  Final   Culture  Setup Time   Final    GRAM POSITIVE COCCI IN CLUSTERS ANAEROBIC BOTTLE ONLY CRITICAL RESULT CALLED TO, READ BACK BY AND VERIFIED WITH: B REAP RN 2325 12/12/14 A BROWNING  Culture   Final    STAPHYLOCOCCUS SPECIES (COAGULASE NEGATIVE) THE SIGNIFICANCE OF ISOLATING THIS ORGANISM FROM A SINGLE SET OF BLOOD CULTURES WHEN MULTIPLE SETS ARE DRAWN IS UNCERTAIN. PLEASE NOTIFY THE MICROBIOLOGY DEPARTMENT WITHIN ONE WEEK IF SPECIATION AND SENSITIVITIES ARE REQUIRED. Performed at [facility]    Report Status 12/15/2014 FINAL  Final  Culture, blood (routine x 2)     Status: None   Collection Time: 12/11/14  8:08 PM  Result Value Ref Range Status   Specimen Description BLOOD LEFT ANTECUBITAL  Final   Special Requests BOTTLES DRAWN AEROBIC AND ANAEROBIC EACH  Final   Culture   Final    NO GROWTH 5 DAYS Performed at [facility]    Report Status 12/16/2014 FINAL  Final  MRSA PCR Screening     Status: None    Collection Time: 12/12/14  5:38 AM  Result Value Ref Range Status   MRSA by PCR NEGATIVE NEGATIVE Final    Comment:        The GeneXpert MRSA Assay (FDA approved for NASAL specimens only), is one component of a comprehensive MRSA colonization surveillance program. It is not intended to diagnose MRSA infection nor to guide or monitor treatment for MRSA infections.   Urine culture     Status: None   Collection Time: 12/12/14 12:45 PM  Result Value Ref Range Status   Specimen Description URINE, CLEAN CATCH  Final   Special Requests NONE  Final   Culture NO GROWTH 1 DAY  Final   Report Status 12/13/2014 FINAL  Final     Studies: No results found.  Scheduled Meds: . aztreonam  1 g Intravenous 3 times per day  . collagenase   Topical Daily  . docusate sodium  100 mg Oral BID  . famotidine  20 mg Oral BID  . folic acid  1 mg Oral Daily  . heparin  5,000 Units Subcutaneous 3 times per day  . insulin aspart  0-9 Units Subcutaneous TID WC  . multivitamin with minerals  1 tablet Oral Daily  . thiamine  100 mg Oral Daily   Or  . thiamine  100 mg Intravenous Daily   Continuous Infusions:    Principal Problem:   Sepsis (HCC) Active Problems:   Scrotal swelling   Acute encephalopathy   Acute renal failure superimposed on stage 3 chronic kidney disease (HCC)   Acute on chronic systolic heart failure (HCC)   Hyponatremia   OSA (obstructive sleep apnea)   Cellulitis of leg, right   Anasarca   Cellulitis   Blood poisoning (HCC)   Rash and nonspecific skin eruption    Time spent: 40 mins    [facility] MD [facility] Pager [phone]. If 7PM-7AM, please contact night-coverage at [email or web address], password [facility] 12/18/2014, 8:57 AM  LOS: 6 days

## 2014-12-19 ENCOUNTER — Encounter (HOSPITAL_COMMUNITY): Payer: Self-pay | Admitting: Internal Medicine

## 2014-12-19 ENCOUNTER — Inpatient Hospital Stay (HOSPITAL_COMMUNITY): Payer: Medicaid Other

## 2014-12-19 DIAGNOSIS — I509 Heart failure, unspecified: Secondary | ICD-10-CM

## 2014-12-19 DIAGNOSIS — F319 Bipolar disorder, unspecified: Secondary | ICD-10-CM

## 2014-12-19 DIAGNOSIS — F3163 Bipolar disorder, current episode mixed, severe, without psychotic features: Secondary | ICD-10-CM

## 2014-12-19 DIAGNOSIS — F315 Bipolar disorder, current episode depressed, severe, with psychotic features: Secondary | ICD-10-CM

## 2014-12-19 HISTORY — DX: Bipolar disorder, unspecified: F31.9

## 2014-12-19 LAB — CBC
HCT: 40.2 % (ref 39.0–52.0)
Hemoglobin: 11.4 g/dL — ABNORMAL LOW (ref 13.0–17.0)
MCH: 19.1 pg — AB (ref 26.0–34.0)
MCHC: 28.4 g/dL — AB (ref 30.0–36.0)
MCV: 67.3 fL — AB (ref 78.0–100.0)
PLATELETS: 332 10*3/uL (ref 150–400)
RBC: 5.97 MIL/uL — AB (ref 4.22–5.81)
RDW: 21.9 % — ABNORMAL HIGH (ref 11.5–15.5)
WBC: 11.1 10*3/uL — ABNORMAL HIGH (ref 4.0–10.5)

## 2014-12-19 LAB — COMPREHENSIVE METABOLIC PANEL
ALBUMIN: 1.8 g/dL — AB (ref 3.5–5.0)
ALK PHOS: 200 U/L — AB (ref 38–126)
ALT: 13 U/L — AB (ref 17–63)
AST: 39 U/L (ref 15–41)
Anion gap: 14 (ref 5–15)
BUN: 26 mg/dL — AB (ref 6–20)
CALCIUM: 8.8 mg/dL — AB (ref 8.9–10.3)
CHLORIDE: 99 mmol/L — AB (ref 101–111)
CO2: 25 mmol/L (ref 22–32)
CREATININE: 1.18 mg/dL (ref 0.61–1.24)
GFR calc Af Amer: >60 mL/min (ref >60)
GFR calc non Af Amer: >60 mL/min (ref >60)
GLUCOSE: 141 mg/dL — AB (ref 65–99)
Potassium: 4.9 mmol/L (ref 3.5–5.1)
SODIUM: 138 mmol/L (ref 135–145)
Total Bilirubin: 2.3 mg/dL — ABNORMAL HIGH (ref 0.3–1.2)
Total Protein: 6.2 g/dL — ABNORMAL LOW (ref 6.5–8.1)

## 2014-12-19 LAB — GLUCOSE, CAPILLARY
Glucose-Capillary: 126 mg/dL — ABNORMAL HIGH (ref 65–99)
Glucose-Capillary: 159 mg/dL — ABNORMAL HIGH (ref 65–99)
Glucose-Capillary: 153 mg/dL — ABNORMAL HIGH (ref 65–99)
Glucose-Capillary: 143 mg/dL — ABNORMAL HIGH (ref 65–99)

## 2014-12-19 LAB — MAGNESIUM: Magnesium: 2.1 mg/dL (ref 1.7–2.4)

## 2014-12-19 MED ORDER — FUROSEMIDE 10 MG/ML IJ SOLN
20.0000 mg | Freq: Once | INTRAMUSCULAR | Status: AC
  Administered 2014-12-19: 20 mg via INTRAVENOUS
  Filled 2014-12-19: qty 2

## 2014-12-19 MED ORDER — HALOPERIDOL LACTATE 5 MG/ML IJ SOLN
3.0000 mg | Freq: Four times a day (QID) | INTRAMUSCULAR | Status: DC | PRN
  Administered 2014-12-19: 3 mg via INTRAVENOUS
  Filled 2014-12-19: qty 1

## 2014-12-19 MED ORDER — ENSURE ENLIVE PO LIQD
237.0000 mL | Freq: Two times a day (BID) | ORAL | Status: DC
  Administered 2014-12-21 – 2014-12-28 (×8): 237 mL via ORAL

## 2014-12-19 MED ORDER — LORAZEPAM 2 MG/ML IJ SOLN
1.0000 mg | Freq: Four times a day (QID) | INTRAMUSCULAR | Status: DC | PRN
  Administered 2014-12-19 – 2014-12-24 (×4): 1 mg via INTRAVENOUS
  Filled 2014-12-19 (×5): qty 1

## 2014-12-19 NOTE — Progress Notes (Addendum)
Restraints unloosened briefly  From both wrists and patient trying to get oob; restraints tied.

## 2014-12-19 NOTE — Care Management Note (Signed)
Case Management Note  Patient Details  Name: Erion Weightman MRN: [id number] Date of Birth: 1966-01-16  Subjective/Objective:      Pt has been caregiver for elderly gentleman, had gotten increasingly unable to care for pt.  Per sister, home was cluttered and dirty, pt had been abusing drugs and ETOH, and can no longer live with the person he was caring for.  CSW is following.              Action/Plan: CM will continue to follow.              Expected Discharge Plan:  Skilled Nursing Facility  In-House Referral:  Clinical Social Work, Museum/gallery exhibitions officer  CM Consult  Status of Service:  In process, will continue to follow  Magdalene River, RN 12/19/2014, 10:27 AM

## 2014-12-19 NOTE — Progress Notes (Signed)
  Echocardiogram 2D Echocardiogram has been performed.  Tom Reynolds 12/19/2014, 2:53 PM

## 2014-12-19 NOTE — Progress Notes (Signed)
[facility] PROGRESS NOTE  Tom Reynolds ZOX:[id number] DOB: [birth year] DOA: 12/11/2014 PCP: No primary care provider on file.  Assessment/Plan: #1 sepsis Patient was admitted with sepsis felt to be secondary to bilateral lower extremity cellulitis, scrotal cellulitis. On admission patient had elevated lactic acid level of 3.83 and was in acute on chronic renal failure with a creatinine going up as high as 2.32 on admission from a baseline of 1.26. Patient was pancultured. Urine cultures were negative. Blood cultures with 1/2 coag neg. staph likely a contaminant. Patient has been seen in consultation by urology recommend antibiotic and wound care treatment with outpatient follow-up. Patient was transitioned to IV clindamycin, however developed a rash and subsequently has been transitioned back to IV Azactam. Continue Azactam for now. Saline lock IV fluids. Follow.  #2 scrotal swelling and cellulitis/ LE cellulitis Patient currently afebrile. WBC has trended down. Patient still with significant scrotal swelling. Patient has been seen in consultation by urology will feel the etiology of patient's swelling and cellulitis is secondary to poor hygiene. CT pelvis was negative for soft tissue gas or abscess. Patient status post Foley catheter placement with good output. KVO IV fluids. Patient was initially on IV vancomycin IV Azactam and subsequently transitioned to IV clindamycin however developed a rash to his back, and now on IV Azactam. Scrotal elevation. Continue wound care dressings per wound care nurse and recommendations. Will likely need follow-up with urology as outpatient.  #3 acute encephalopathy Likely multifactorial secondary to alcohol withdrawal and current infection. Patient less agitated after changing Ativan to Valium for the withdrawal protocol. Dilaudid has been discontinued. QTC is normal, may need Haldol as needed. Patient was aggressive yesterday and had to be given some Haldol.  Patient does state he has a prior history of bipolar disorder which was verified on care everywhere however currently on no medications. Patient states supposed to be on Zoloft 200 mg daily. If no improvement on Valium and still very agitated and withdrawal may need to consult with critical care to place patient on a Precedex drip. Will consult with psychiatry for further evaluation and management.  #4 acute renal failure chronic kidney disease stage II Baseline renal function is 1.26. On admission patient was up to 2.32. Creatinine currently at 1.18. Patient however also noted to be volume loaded on examination. Renal function has improved with gentle hydration. NSL IV fluids. Follow urine output. Continue to hold nephrotoxic agents. May need diuresis however blood pressure is borderline. Patient with a urine output of over the past 24 hours. Will give a small dose of IV Lasix and follow. Follow for now.  #5 hyponatremia Resolved.  #6 anasarca Questionable etiology. Patient does have a low albumen. Patient with normal LFTs. Patient however with prior history of cardiomyopathy and chronic systolic heart failure. Check a 2-D echo. Patient with no respiratory symptoms however does have bilateral lower extremity edema and lower abdominal edema as well as scrotal edema. Saline lock IVF. Patient with borderline blood pressure and a such unable to diuresed with IV Lasix. Patient seems to be auto diuresing and had a urine output of 675cc yesterday. Follow. KVO IV fluids. Follow I/0. Will give a small dose of Lasix 20 mg IV 1. Follow.  #7 ETOH abuse Patient likely going through withdrawal with agitation. Per nursing improvement with agitation with change from ativan to valium on withdrawal protocol. Thiamine. Folic acid. Follow.  #8 history of cardiomyopathy/chronic systolic heart failure with EF of 30% Patient not on any heart failure  medications in the outpatient setting. Will need to hold off on  ACE inhibitor for now secondary to problem #4. Likely might need diuresis. Patient with 675 L urine output yesterday. Will check a 2-D echo. Will give a dose of Lasix 20 mg IV 1 and monitor blood pressure closely.  #9 bipolar disorder Patient with a history of bipolar disorder per patient and verified on care everywhere from her PCPs note. Patient states his post meal Zoloft 200 mg daily. Patient has had some agitation and aggressive behavior during this hospitalization. Will consult with psychiatry for further evaluation and management. Will hold off on starting patient on Zoloft until he is assessed by psychiatry.  #10 gastroesophageal reflux disease Pepcid.  #11 prophylaxis Pepcid for GI prophylaxis. Heparin for DVT prophylaxis.   Code Status: Full Family Communication: No family at bedside. Disposition Plan: Remain the step down unit   Consultants:  Urology: Dr. Neva Seat 12/12/2014  Procedures:  CT pelvis 12/12/2014  Chest x-ray 12/11/2014    Antibiotics:  IV ciprofloxacin 12/11/2014>>> ninth 29 2016  IV clindamycin 12/11/2014>>> 12/11/2014  IV clindamycin 12/16/2014>>>> 12/17/2014  IV Azactam 12/12/2014>>> 12/15/2014  IV Azactam 12/17/2014  IV vancomycin 12/11/2014>>>> 12/15/2014  HPI/Subjective: Patient alert. Less agitated. Following commands. Patient asking when he can go home.  Objective: Filed Vitals:   12/19/14 0803  BP: 118/87  Pulse: 116  Temp: 97.8 F (36.6 C)  Resp: 20    Intake/Output Summary (Last 24 hours) at 12/19/14 1046 Last data filed at 12/19/14 0419  Gross per 24 hour  Intake    700 ml  Output    475 ml  Net    225 ml   Filed Weights   12/11/14 1825 12/12/14 0532  Weight: 125.278 kg (276 lb 3 oz) 125.4 kg (276 lb 7.3 oz)    Exam:   General:  Alert  Cardiovascular: RRR with 3/6 SEM  Respiratory: CTAB anterior lung fields  Abdomen: Soft, slight distention, positive bowel sounds, no rebound, no guarding. Rash noted on  the back in the lower torso improving. Chronic scrotal wound with eschar. Scrotal swelling  Musculoskeletal: No clubbing or cyanosis. 2+ bilateral lower extremity edema. Right lower extremity with decreased erythema, wounds ( 2.5 x 2.5 , and 1.5 x 1.5cm) with sloughed/eschar minimal amount of yellow drainage noted on the right lower extremity.  Data Reviewed: Basic Metabolic Panel:  Recent Labs Lab 12/12/14 1231 12/14/14 0413 12/16/14 0417 12/17/14 0821 12/18/14 0425 12/19/14 0328  NA 131* 131* 135 139 136 138  K 4.1 4.1 4.3 4.7 4.5 4.9  CL 95* 94* 101 102 98* 99*  CO2 21* 24 24 23 25 25   GLUCOSE 132* 119* 131* 106* 148* 141*  BUN 37* 43* 30* 31* 27* 26*  CREATININE 2.86* 2.74* 1.62* 1.49* 1.37* 1.18  CALCIUM 8.4* 8.0* 8.3* 8.7* 8.5* 8.8*  MG 1.8  --   --  2.1 2.1 2.1   Liver Function Tests:  Recent Labs Lab 12/12/14 1231 12/19/14 0328  AST 34 39  ALT 14* 13*  ALKPHOS 196* 200*  BILITOT 4.1* 2.3*  PROT 6.4* 6.2*  ALBUMIN 1.9* 1.8*   No results for input(s): LIPASE, AMYLASE in the last 168 hours.  Recent Labs Lab 12/16/14 1644  AMMONIA 37*   CBC:  Recent Labs Lab 12/12/14 1231 12/14/14 0413 12/17/14 0821 12/18/14 0425 12/19/14 0328  WBC 16.7* 9.8 10.3 11.1* 11.1*  HGB 10.4* 10.9* 11.5* 10.8* 11.4*  HCT 36.7* 37.7* 41.2 38.2* 40.2  MCV 67.1* 66.4* 69.5*  67.4* 67.3*  PLT 331 275 295 291 332   Cardiac Enzymes:  Recent Labs Lab 12/12/14 1231 12/12/14 2025 12/13/14 0043  TROPONINI 0.08* 0.08* 0.05*   BNP (last 3 results)  Recent Labs  12/11/14 1845  BNP 1897.6*    ProBNP (last 3 results) No results for input(s): PROBNP in the last 8760 hours.  CBG:  Recent Labs Lab 12/18/14 0803 12/18/14 1149 12/18/14 1637 12/18/14 2145 12/19/14 0757  GLUCAP 147* 141* 180* 144* 126*    Recent Results (from the past 240 hour(s))  Culture, blood (routine x 2)     Status: None   Collection Time: 12/11/14  7:30 PM  Result Value Ref Range Status    Specimen Description BLOOD RIGHT ANTECUBITAL  Final   Special Requests BOTTLES DRAWN AEROBIC AND ANAEROBIC EACH  Final   Culture  Setup Time   Final    GRAM POSITIVE COCCI IN CLUSTERS ANAEROBIC BOTTLE ONLY CRITICAL RESULT CALLED TO, READ BACK BY AND VERIFIED WITH: B REAP RN 2325 12/12/14 A BROWNING    Culture   Final    STAPHYLOCOCCUS SPECIES (COAGULASE NEGATIVE) THE SIGNIFICANCE OF ISOLATING THIS ORGANISM FROM A SINGLE SET OF BLOOD CULTURES WHEN MULTIPLE SETS ARE DRAWN IS UNCERTAIN. PLEASE NOTIFY THE MICROBIOLOGY DEPARTMENT WITHIN ONE WEEK IF SPECIATION AND SENSITIVITIES ARE REQUIRED. Performed at [facility]    Report Status 12/15/2014 FINAL  Final  Culture, blood (routine x 2)     Status: None   Collection Time: 12/11/14  8:08 PM  Result Value Ref Range Status   Specimen Description BLOOD LEFT ANTECUBITAL  Final   Special Requests BOTTLES DRAWN AEROBIC AND ANAEROBIC EACH  Final   Culture   Final    NO GROWTH 5 DAYS Performed at [facility]    Report Status 12/16/2014 FINAL  Final  MRSA PCR Screening     Status: None   Collection Time: 12/12/14  5:38 AM  Result Value Ref Range Status   MRSA by PCR NEGATIVE NEGATIVE Final    Comment:        The GeneXpert MRSA Assay (FDA approved for NASAL specimens only), is one component of a comprehensive MRSA colonization surveillance program. It is not intended to diagnose MRSA infection nor to guide or monitor treatment for MRSA infections.   Urine culture     Status: None   Collection Time: 12/12/14 12:45 PM  Result Value Ref Range Status   Specimen Description URINE, CLEAN CATCH  Final   Special Requests NONE  Final   Culture NO GROWTH 1 DAY  Final   Report Status 12/13/2014 FINAL  Final     Studies: No results found.  Scheduled Meds: . aztreonam  1 g Intravenous 3 times per day  . collagenase   Topical Daily  . docusate sodium  100 mg Oral BID  . famotidine  20 mg Oral BID  . folic acid  1 mg Oral  Daily  . heparin  5,000 Units Subcutaneous 3 times per day  . insulin aspart  0-9 Units Subcutaneous TID WC  . multivitamin with minerals  1 tablet Oral Daily  . thiamine  100 mg Oral Daily   Continuous Infusions:    Principal Problem:   Sepsis (HCC) Active Problems:   Scrotal swelling   Acute encephalopathy   Acute renal failure superimposed on stage 3 chronic kidney disease (HCC)   Acute on chronic systolic heart failure (HCC)   Hyponatremia   OSA (obstructive sleep apnea)  Cellulitis of leg, right   Anasarca   Cellulitis   Blood poisoning (HCC)   Rash and nonspecific skin eruption   Bipolar disorder (HCC)    Time spent: 40 mins    [facility] MD [facility] Pager [phone]. If 7PM-7AM, please contact night-coverage at [email or web address], password [facility] 12/19/2014, 10:46 AM  LOS: 7 days

## 2014-12-19 NOTE — Consult Note (Signed)
[facility] Consult   Reason for Consult:  Medication management for agitation and aggression Referring Physician:  Dr. Grandville Silos Patient Identification: Tom Reynolds MRN:  [id number] Principal Diagnosis: Bipolar disorder [facility]) Diagnosis:   Patient Active Problem List   Diagnosis Date Noted  . Bipolar disorder ([place]) [F31.9] 12/19/2014  . Rash and nonspecific skin eruption [R21] 12/17/2014  . Acute encephalopathy [G93.40] 12/17/2014  . Sepsis ([place]) [A41.9] 12/12/2014  . Acute renal failure superimposed on stage 3 chronic kidney disease ([place]) [N17.9, N18.3] 12/12/2014  . Acute on chronic systolic heart failure ([place]) [I50.23] 12/12/2014  . Hyponatremia [E87.1] 12/12/2014  . OSA (obstructive sleep apnea) [G47.33] 12/12/2014  . Cellulitis of leg, right [L03.115] 12/12/2014  . Anasarca [R60.1]   . Cellulitis [L03.90]   . Scrotal swelling [N50.89]   . Blood poisoning ([place]) [A41.9]     Total Time spent with patient: 1 hour  Subjective:   Tom Reynolds is a 49 y.o. male patient admitted with sepsis.  HPI:  Tom Reynolds is a 49 years old male admitted to [facility] with sepsis, anasarca and cellulitis. Patient has been noncompliant with medical care over several years and also has a history of substance abuse and also incarceration several years ago. Psychiatric consultation requested for medication management of current agitation and aggressive behaviors. Patient received Haldol 5 mg this morning which resulted patient being sedated and poorly cooperative with this evaluation. Patient has been irritable, agitated and screaming at 1 minute and he has been drowsy and sleepy in his bed. Patient has soft restraints to prevent hurting himself and others due to his agitation. Review of EKG indicated has normal QTC. Patient has previous history of bipolar disorder as per outpatient medical records that patient has not taken any medication for bipolar disorder but was taken Zoloft  in the past. Spoke with the patient sister who stated that about a year ago his mother passed away secondary to heart problems since then he had increased drinking alcohol and also using crack cocaine. His last use of alcohol about 7-8 months ago and crack cocaine about a month ago. Patient has been staying with the friend of the family but not able to care for himself. Patient denies current symptoms of depression, anxiety, suicidal/homicidal ideation but he appeared responding to internal stimuli during my visit. Patient also appeared to being delirious condition as described above.  Past Psychiatric History: He has a history of polysubstance abuse versus dependence but has no known treatment history. Patient has no acute psychiatric hospitalizations even though he was diagnosed with mood disorder and received a Zoloft as per the primary care physician office notes. Patient is currently not taking any psychiatric medications.  Risk to Self: Is patient at risk for suicide?: No Risk to Others:   Prior Inpatient Therapy:   Prior Outpatient Therapy:    Past Medical History:  Past Medical History  Diagnosis Date  . Diabetes mellitus without complication ([place])   . Hypertension   . CHF (congestive heart failure) ([place])   . Bipolar disorder ([place]) 12/19/2014   History reviewed. No pertinent past surgical history. Family History: History reviewed. No pertinent family history. Family Psychiatric  History: Patient is one of the 8 siblings. His mother passed away about a year ago secondary to heart problems and he has 5 siblings living and supportive to each other. No family history of mental illness or substance abuse. Social History:  History  Alcohol Use No     History  Drug  Use Not on file    Social History   Social History  . Marital Status: Divorced    Spouse Name: N/A  . Number of Children: N/A  . Years of Education: N/A   Social History Main Topics  . Smoking status: Former Research scientist (life sciences)  .  Smokeless tobacco: None  . Alcohol Use: No  . Drug Use: None  . Sexual Activity: Not Asked   Other Topics Concern  . None   Social History Narrative   Additional Social History:                          Allergies:   Allergies  Allergen Reactions  . Clindamycin/Lincomycin Rash  . Penicillins Other (See Comments)    intolerance    Labs:  Results for orders placed or performed during the hospital encounter of 12/11/14 (from the past 48 hour(s))  Glucose, capillary     Status: Abnormal   Collection Time: 12/17/14 12:30 PM  Result Value Ref Range   Glucose-Capillary 156 (H) 65 - 99 mg/dL  Glucose, capillary     Status: Abnormal   Collection Time: 12/17/14  4:43 PM  Result Value Ref Range   Glucose-Capillary 152 (H) 65 - 99 mg/dL  Glucose, capillary     Status: Abnormal   Collection Time: 12/17/14  9:43 PM  Result Value Ref Range   Glucose-Capillary 137 (H) 65 - 99 mg/dL  CBC     Status: Abnormal   Collection Time: 12/18/14  4:25 AM  Result Value Ref Range   WBC 11.1 (H) 4.0 - 10.5 K/uL   RBC 5.67 4.22 - 5.81 MIL/uL   Hemoglobin 10.8 (L) 13.0 - 17.0 g/dL   HCT 38.2 (L) 39.0 - 52.0 %   MCV 67.4 (L) 78.0 - 100.0 fL   MCH 19.0 (L) 26.0 - 34.0 pg   MCHC 28.3 (L) 30.0 - 36.0 g/dL   RDW 21.8 (H) 11.5 - 15.5 %   Platelets 291 150 - 400 K/uL    Comment: SPECIMEN CHECKED FOR CLOTS REPEATED TO VERIFY CONSISTENT WITH PREVIOUS RESULT   Basic metabolic panel     Status: Abnormal   Collection Time: 12/18/14  4:25 AM  Result Value Ref Range   Sodium 136 135 - 145 mmol/L   Potassium 4.5 3.5 - 5.1 mmol/L   Chloride 98 (L) 101 - 111 mmol/L   CO2 25 22 - 32 mmol/L   Glucose, Bld 148 (H) 65 - 99 mg/dL   BUN 27 (H) 6 - 20 mg/dL   Creatinine, Ser 1.37 (H) 0.61 - 1.24 mg/dL   Calcium 8.5 (L) 8.9 - 10.3 mg/dL   GFR calc non Af Amer 58 (L) >60 mL/min   GFR calc Af Amer >60 >60 mL/min    Comment: (NOTE) The eGFR has been calculated using the CKD EPI equation. This  calculation has not been validated in all clinical situations. eGFR's persistently <60 mL/min signify possible Chronic Kidney Disease.    Anion gap 13 5 - 15  Magnesium     Status: None   Collection Time: 12/18/14  4:25 AM  Result Value Ref Range   Magnesium 2.1 1.7 - 2.4 mg/dL  Glucose, capillary     Status: Abnormal   Collection Time: 12/18/14  8:03 AM  Result Value Ref Range   Glucose-Capillary 147 (H) 65 - 99 mg/dL   Comment 1 Document in Chart   Glucose, capillary     Status: Abnormal  Collection Time: 12/18/14 11:49 AM  Result Value Ref Range   Glucose-Capillary 141 (H) 65 - 99 mg/dL   Comment 1 Document in Chart   Glucose, capillary     Status: Abnormal   Collection Time: 12/18/14  4:37 PM  Result Value Ref Range   Glucose-Capillary 180 (H) 65 - 99 mg/dL  Glucose, capillary     Status: Abnormal   Collection Time: 12/18/14  9:45 PM  Result Value Ref Range   Glucose-Capillary 144 (H) 65 - 99 mg/dL  Comprehensive metabolic panel     Status: Abnormal   Collection Time: 12/19/14  3:28 AM  Result Value Ref Range   Sodium 138 135 - 145 mmol/L   Potassium 4.9 3.5 - 5.1 mmol/L   Chloride 99 (L) 101 - 111 mmol/L   CO2 25 22 - 32 mmol/L   Glucose, Bld 141 (H) 65 - 99 mg/dL   BUN 26 (H) 6 - 20 mg/dL   Creatinine, Ser 1.18 0.61 - 1.24 mg/dL   Calcium 8.8 (L) 8.9 - 10.3 mg/dL   Total Protein 6.2 (L) 6.5 - 8.1 g/dL   Albumin 1.8 (L) 3.5 - 5.0 g/dL   AST 39 15 - 41 U/L   ALT 13 (L) 17 - 63 U/L   Alkaline Phosphatase 200 (H) 38 - 126 U/L   Total Bilirubin 2.3 (H) 0.3 - 1.2 mg/dL   GFR calc non Af Amer >60 >60 mL/min   GFR calc Af Amer >60 >60 mL/min    Comment: (NOTE) The eGFR has been calculated using the CKD EPI equation. This calculation has not been validated in all clinical situations. eGFR's persistently <60 mL/min signify possible Chronic Kidney Disease.    Anion gap 14 5 - 15  CBC     Status: Abnormal   Collection Time: 12/19/14  3:28 AM  Result Value Ref Range    WBC 11.1 (H) 4.0 - 10.5 K/uL   RBC 5.97 (H) 4.22 - 5.81 MIL/uL   Hemoglobin 11.4 (L) 13.0 - 17.0 g/dL   HCT 40.2 39.0 - 52.0 %   MCV 67.3 (L) 78.0 - 100.0 fL   MCH 19.1 (L) 26.0 - 34.0 pg   MCHC 28.4 (L) 30.0 - 36.0 g/dL   RDW 21.9 (H) 11.5 - 15.5 %   Platelets 332 150 - 400 K/uL    Comment: SPECIMEN CHECKED FOR CLOTS REPEATED TO VERIFY CONSISTENT WITH PREVIOUS RESULT   Magnesium     Status: None   Collection Time: 12/19/14  3:28 AM  Result Value Ref Range   Magnesium 2.1 1.7 - 2.4 mg/dL  Glucose, capillary     Status: Abnormal   Collection Time: 12/19/14  7:57 AM  Result Value Ref Range   Glucose-Capillary 126 (H) 65 - 99 mg/dL    Current Facility-Administered Medications  Medication Dose Route Frequency Provider Last Rate Last Dose  . acetaminophen (TYLENOL) tablet 650 mg  650 mg Oral Q6H PRN Reyne Dumas, MD       Or  . acetaminophen (TYLENOL) suppository 650 mg  650 mg Rectal Q6H PRN Reyne Dumas, MD      . aztreonam (AZACTAM) 1 g in dextrose 5 % 50 mL IVPB  1 g Intravenous 3 times per day Velvet Bathe, MD   1 g at 12/19/14 1884  . collagenase (SANTYL) ointment   Topical Daily Reyne Dumas, MD   1 application at 16/60/63 1425  . diazepam (VALIUM) injection 5-10 mg  5-10 mg Intravenous Q1H PRN Quillian Quince  Durene Cal, MD   5 mg at 12/19/14 [id number]  . docusate sodium (COLACE) capsule 100 mg  100 mg Oral BID Reyne Dumas, MD   100 mg at 12/19/14 1030  . famotidine (PEPCID) tablet 20 mg  20 mg Oral BID Eugenie Filler, MD   20 mg at 12/19/14 1000  . feeding supplement (ENSURE ENLIVE) (ENSURE ENLIVE) liquid 237 mL  237 mL Oral BID BM Eugenie Filler, MD      . folic acid (FOLVITE) tablet 1 mg  1 mg Oral Daily Velvet Bathe, MD   1 mg at 12/19/14 1030  . haloperidol lactate (HALDOL) injection 5 mg  5 mg Intravenous Q6H PRN Eugenie Filler, MD   5 mg at 12/19/14 0959  . heparin injection 5,000 Units  5,000 Units Subcutaneous 3 times per day Reyne Dumas, MD   5,000 Units at 12/19/14  0616  . insulin aspart (novoLOG) injection 0-9 Units  0-9 Units Subcutaneous TID WC Reyne Dumas, MD   1 Units at 12/19/14 0830  . levalbuterol (XOPENEX) nebulizer solution 0.63 mg  0.63 mg Nebulization Q6H PRN Reyne Dumas, MD      . morphine 2 MG/ML injection 2 mg  2 mg Intravenous Q4H PRN Eugenie Filler, MD   2 mg at 12/18/14 2019  . multivitamin with minerals tablet 1 tablet  1 tablet Oral Daily Velvet Bathe, MD   1 tablet at 12/19/14 1005  . thiamine (VITAMIN B-1) tablet 100 mg  100 mg Oral Daily Velvet Bathe, MD   100 mg at 12/19/14 1000    Musculoskeletal: Strength & Muscle Tone: decreased Gait & Station: unable to stand Patient leans: N/A  Psychiatric Specialty Exam: ROS patient complains about being sick, swellings all over his body, being infected, restlessness and agitation. No Fever-chills, No Headache, No changes with Vision or hearing, reports vertigo No problems swallowing food or Liquids, No Chest pain, Cough or Shortness of Breath, No Abdominal pain, No Nausea or Vommitting, Bowel movements are regular, No Blood in stool or Urine, No dysuria, No new skin rashes or bruises, No new joints pains-aches,  No new weakness, tingling, numbness in any extremity, No recent weight gain or loss, No polyuria, polydypsia or polyphagia,   A full 10 point Review of Systems was done, except as stated above, all other Review of Systems were negative.  Blood pressure 117/78, pulse 121, temperature 97.9 F (36.6 C), temperature source Axillary, resp. rate 19, height _0  (1.702 m), weight 125.4 kg (276 lb 7.3 oz), SpO2 97 %.Body mass index is 43.29 kg/(m^2).  General Appearance: Guarded  Eye Contact::  Minimal  Speech:  Blocked, Slow and Slurred  Volume:  Decreased  Mood:  Angry and Irritable  Affect:  Constricted and Depressed  Thought Process:  Irrelevant and Tangential  Orientation:  Full (Time, Place, and Person)  Thought Content:  Rumination  Suicidal Thoughts:  No   Homicidal Thoughts:  No  Memory:  Immediate;   Fair Recent;   Fair  Judgement:  Impaired  Insight:  Shallow  Psychomotor Activity:  Restlessness  Concentration:  Fair  Recall:  [place]: Fair  Akathisia:  Negative  Handed:  Right  AIMS (if indicated):     Assets:  Communication Skills Desire for Improvement Housing Leisure Time Resilience Social Support Talents/Skills Transportation  ADL's:  Impaired  Cognition: Impaired,  Mild  Sleep:      Treatment Plan Summary: Patient is restless, irritable, angry, shouting  and screaming during my evaluation and he required Haldol and soft restraints since yesterday. Patient has a history of substance abuse and limited resources for medical care. Daily contact with patient to assess and evaluate symptoms and progress in treatment and Medication management  Disposition: Continue safety sitter as patient has been in delirious state and agitated Haldol 3 mg IV every 6 hours as needed for agitation and aggressive behavior Ativan 1 mg every 6 hours as needed for restlessness and agitation Continue with Valium 5 mg IV as needed for uncontrollable anxiety/panic attacks Monitor for the QTC prolongation if needed Patient does not meet criteria for psychiatric inpatient admission. Supportive therapy provided about ongoing stressors. Appreciate psychiatric consultation Please contact [phone] or [phone] if needs further assistance   Tovah Slavick,JANARDHAHA R. 12/19/2014 12:15 PM

## 2014-12-19 NOTE — Progress Notes (Addendum)
Initial Nutrition Assessment  DOCUMENTATION CODES:   Morbid obesity  INTERVENTION:   Continue Ensure Enlive po BID, each supplement provides 350 kcal and 20 grams of protein   Continue MVI with minerals daily  NUTRITION DIAGNOSIS:   Increased nutrient needs related to wound healing as evidenced by estimated needs  GOAL:   Patient will meet greater than or equal to 90% of their needs  MONITOR:   PO intake, Supplement acceptance, Labs, Weight trends, Skin, I & O's  REASON FOR ASSESSMENT:   Low Braden  ASSESSMENT:   49 y.o. Male with history of alcohol abuse, OSA, stage III CKD, who presented with localized infection and scrotum which has been evaluated by urology and altered mental status.   RD unable to obtain nutrition hx.  PO intake 50% per flowsheet records.  Nutrient needs increase given scrotal wound presence.  Also with swelling and cellulitis/LE cellulitis.  Ensure Enlive oral nutrition supplements in place.  Also receiving MVI daily via Alcohol Withdrawal Prevention Protocol.  Low braden score places patient at risk for further skin breakdown.  Nutrition focused physical exam completed.  No muscle or subcutaneous fat depletion noticed.  Diet Order:  Diet heart healthy/carb modified Room service appropriate?: Yes; Fluid consistency:: Thin  Skin:  Wound (see comment) (chronic scrotal wound with eschar)  Last BM:  N/A  Height:   Ht Readings from Last 1 Encounters:  12/12/14  (1.702 m)    Weight:   Wt Readings from Last 1 Encounters:  12/12/14 276 lb 7.3 oz (125.4 kg)    Ideal Body Weight:  67.2 kg  BMI:  Body mass index is 43.29 kg/(m^2).  Estimated Nutritional Needs:   Kcal:  2100-2300  Protein:  120-130 gm  Fluid:  2.1-2.3 L  EDUCATION NEEDS:   No education needs identified at this time  [name], RD, LDN Pager #: [phone] After-Hours Pager #: [phone]

## 2014-12-20 ENCOUNTER — Inpatient Hospital Stay (HOSPITAL_COMMUNITY): Payer: Medicaid Other

## 2014-12-20 LAB — BASIC METABOLIC PANEL
ANION GAP: 11 (ref 5–15)
BUN: 24 mg/dL — AB (ref 6–20)
CHLORIDE: 103 mmol/L (ref 101–111)
CO2: 25 mmol/L (ref 22–32)
Calcium: 8.4 mg/dL — ABNORMAL LOW (ref 8.9–10.3)
Creatinine, Ser: 1.07 mg/dL (ref 0.61–1.24)
GFR calc Af Amer: >60 mL/min (ref >60)
GFR calc non Af Amer: >60 mL/min (ref >60)
Glucose, Bld: 141 mg/dL — ABNORMAL HIGH (ref 65–99)
POTASSIUM: 4.8 mmol/L (ref 3.5–5.1)
SODIUM: 139 mmol/L (ref 135–145)

## 2014-12-20 LAB — CBC
HEMATOCRIT: 37.2 % — AB (ref 39.0–52.0)
HEMOGLOBIN: 10.4 g/dL — AB (ref 13.0–17.0)
MCH: 18.9 pg — ABNORMAL LOW (ref 26.0–34.0)
MCHC: 28 g/dL — ABNORMAL LOW (ref 30.0–36.0)
MCV: 67.8 fL — AB (ref 78.0–100.0)
Platelets: 257 10*3/uL (ref 150–400)
RBC: 5.49 MIL/uL (ref 4.22–5.81)
RDW: 21.9 % — ABNORMAL HIGH (ref 11.5–15.5)
WBC: 9.3 10*3/uL (ref 4.0–10.5)

## 2014-12-20 LAB — MAGNESIUM: Magnesium: 2 mg/dL (ref 1.7–2.4)

## 2014-12-20 LAB — TROPONIN I
TROPONIN I: 0.03 ng/mL (ref <0.031)
TROPONIN I: 0.04 ng/mL — AB (ref <0.031)
Troponin I: 0.03 ng/mL (ref <0.031)

## 2014-12-20 LAB — GLUCOSE, CAPILLARY
GLUCOSE-CAPILLARY: 233 mg/dL — AB (ref 65–99)
Glucose-Capillary: 168 mg/dL — ABNORMAL HIGH (ref 65–99)
Glucose-Capillary: 131 mg/dL — ABNORMAL HIGH (ref 65–99)
Glucose-Capillary: 141 mg/dL — ABNORMAL HIGH (ref 65–99)

## 2014-12-20 LAB — BRAIN NATRIURETIC PEPTIDE: B NATRIURETIC PEPTIDE 5: 2510 pg/mL — AB (ref 0.0–100.0)

## 2014-12-20 LAB — T4, FREE: Free T4: 0.93 ng/dL (ref 0.61–1.12)

## 2014-12-20 LAB — TSH: TSH: 9.069 u[IU]/mL — AB (ref 0.350–4.500)

## 2014-12-20 MED ORDER — NITROGLYCERIN 2 % TD OINT
0.5000 [in_us] | TOPICAL_OINTMENT | Freq: Once | TRANSDERMAL | Status: AC
  Administered 2014-12-20: 0.5 [in_us] via TOPICAL
  Filled 2014-12-20: qty 30

## 2014-12-20 MED ORDER — FUROSEMIDE 10 MG/ML IJ SOLN
40.0000 mg | Freq: Two times a day (BID) | INTRAMUSCULAR | Status: DC
  Administered 2014-12-20 – 2014-12-21 (×3): 40 mg via INTRAVENOUS
  Filled 2014-12-20 (×3): qty 4

## 2014-12-20 NOTE — Progress Notes (Signed)
[facility] PROGRESS NOTE  Tom Reynolds ZOX:[id number] DOB: [birth year] DOA: 12/11/2014 PCP: No primary care provider on file.  Assessment/Plan:  #1 history of cardiomyopathy/acute on chronic systolic heart failure with EF of 15-20%per 2 d echo 12/19/2014 Patient not on any heart failure medications in the outpatient setting. Patient noted to be in acute on chronic heart failure with anasarca and now complains of shortness of breath this morning. Patient sounds wet on physical exam. Will get a chest x-ray. Will cycle cardiac enzymes every 6 hours 3. Patient early on in the hospitalization was in acute renal failure and a such ACE inhibitor was discontinued. 2-D echo done on 12/19/2014 with a EF of 15-20% which is worse than prior EF of 30%. Patient with moderate to severe mitral valvular regurgitation, severely dilated left atrium, moderately dilated right ventricle and severely reduced right ventricular systolic function. Moderately dilated right atrium. Moderate pulmonary valvular regurgitation. Patient was given Lasix 20 mg IV 1 yesterday with 1.025 L urine output. Patient's renal function has improved and as such will place on Lasix 40 mg IV every 12 hours. Strict I's and O's. Daily weights.CXR. Will hold off on beta blocker at this time due to acute decompensation. Continue to hold off on is due to recent acute renal failure during this hospitalization. Place on BIPAP. Will consult with cardiology for further evaluation and management.   #2 anasarca Questionable etiology. Patient does have a low albumen. Patient with normal LFTs. Patient however with prior history of cardiomyopathy and currently in acute on chronic systolic heart failure. Patient with complaints of shortness of breath. 2-D echo was done on 12/19/2014 with a depressed ejection fraction of 15-20% worse than prior EF of 30%. Patient also noted to have moderate to severe mitral valvular regurgitation, severely dilated left atrium  moderately dilated right ventricle, severely reduced right ventricular systolic function, moderately dilated right atrium and moderate pulmonary valvular regurgitation. Patient was given a dose of Lasix 20 mg IV 1 with 1.025 L of urine output. Blood pressure currently stable. Will place on Lasix 40 mg IV every 12 hours.  #3 sepsis Patient was admitted with sepsis felt to be secondary to bilateral lower extremity cellulitis, scrotal cellulitis. On admission patient had elevated lactic acid level of 3.83 and was in acute on chronic renal failure with a creatinine going up as high as 2.32 on admission from a baseline of 1.26. Patient was pancultured. Urine cultures were negative. Blood cultures with 1/2 coag neg. staph likely a contaminant. Patient has been seen in consultation by urology recommend antibiotic and wound care treatment with outpatient follow-up. Patient was transitioned to IV clindamycin, however developed a rash and subsequently has been transitioned back to IV Azactam. Continue Azactam for now. Saline lock IV fluids. Follow.  #4 scrotal swelling and cellulitis/ LE cellulitis Patient currently afebrile. WBC has trended down. Patient still with significant scrotal swelling. Patient has been seen in consultation by urology will feel the etiology of patient's swelling and cellulitis is secondary to poor hygiene. CT pelvis was negative for soft tissue gas or abscess. Patient status post Foley catheter placement with good output. KVO IV fluids. Patient was initially on IV vancomycin and IV Azactam and subsequently transitioned to IV clindamycin however developed a rash to his back, and now on IV Azactam. Scrotal elevation. Continue wound care dressings per wound care nurse and recommendations. Will likely need follow-up with urology as outpatient.  #5 acute encephalopathy Likely multifactorial secondary to alcohol withdrawal and current infection. Patient  less agitated after changing Ativan to  Valium for the withdrawal protocol. Dilaudid has been discontinued. QTC is normal, may need Haldol as needed. Patient was aggressive and had to be given some Haldol 2 days ago. Patient does state he has a prior history of bipolar disorder which was verified on care everywhere however currently on no medications. Patient states supposed to be on Zoloft 200 mg daily. Patient has been seen in consultation by psychiatry and Haldol dose has been decreased to 3 g IV every 6 hours as needed, Ativan 1 mg every 6 hours as needed for restlessness and agitation and to continue Valium 5 mg IV as needed for uncontrollable anxiety/panic attacks per psych recommendations. Psychiatry following and appreciate input and recommendations.  #6 acute renal failure chronic kidney disease stage II Baseline renal function is 1.26. On admission patient was up to 2.32. Creatinine currently at 1.18. Patient however also noted to be volume loaded on examination. Renal function has improved with gentle hydration. NSL IV fluids. Follow urine output. Continue to hold nephrotoxic agents. Place on IV Lasix.  #7 hyponatremia Resolved.  #8 ETOH abuse Patient likely going through withdrawal with agitation. Per nursing improvement with agitation with change from ativan to valium on withdrawal protocol. Thiamine. Folic acid. Follow.  #9 bipolar disorder Patient with a history of bipolar disorder per patient and verified on care everywhere from her PCPs note. Patient states was on Zoloft 200 mg daily. Patient has had some agitation and aggressive behavior during this hospitalization. Patient has been seen in consultation by psychiatry who recommended continuing safety sitter. Haldol has been changed to 3 mg IV every 6 hours when necessary agitation and aggressive behavior. Ativan 1 mg every 6 hours as needed for restlessness and agitation. Was recommended to continue Valium 5 mg IV as needed for uncontrollable anxiety/panic attacks. Appreciate  psychiatric input.   #10 gastroesophageal reflux disease Pepcid.  #11 prophylaxis Pepcid for GI prophylaxis. Heparin for DVT prophylaxis.   Code Status: Full Family Communication: No family at bedside. Disposition Plan: Remain the step down unit   Consultants:  Urology: Dr. Neva Seat 12/12/2014  Psychiatry: Dr. Elsie Saas 12/19/2014  Procedures:  CT pelvis 12/12/2014  Chest x-ray 12/11/2014  2-D echo 12/19/2014  Antibiotics:  IV ciprofloxacin 12/11/2014>>> ninth 29 2016  IV clindamycin 12/11/2014>>> 12/11/2014  IV clindamycin 12/16/2014>>>> 12/17/2014  IV Azactam 12/12/2014>>> 12/15/2014  IV Azactam 12/17/2014  IV vancomycin 12/11/2014>>>> 12/15/2014  HPI/Subjective: Patient alert, screaming, c/o SOB. nO cp.  Objective: Filed Vitals:   12/20/14 0400  BP: 106/76  Pulse: 117  Temp: 97.5 F (36.4 C)  Resp: 12    Intake/Output Summary (Last 24 hours) at 12/20/14 0923 Last data filed at 12/20/14 0509  Gross per 24 hour  Intake    422 ml  Output   1025 ml  Net   -603 ml   Filed Weights   12/11/14 1825 12/12/14 0532  Weight: 125.278 kg (276 lb 3 oz) 125.4 kg (276 lb 7.3 oz)    Exam:   General:  Alert. Screaming.In soft restraints.  Cardiovascular: Tachycardia with 3/6 SEM  Respiratory: Diffuse rhonchorus and crackles. No wheezing.  Abdomen: Soft, slight distention, positive bowel sounds, no rebound, no guarding. Rash noted on the back in the lower torso improving. Chronic scrotal wound with eschar. Scrotal swelling  Musculoskeletal: No clubbing or cyanosis. 3+ bilateral lower extremity edema up to lower abdomen. Right lower extremity with decreased erythema, wounds ( 2.5 x 2.5 , and 1.5 x 1.5cm) with sloughed/eschar  minimal amount of yellow drainage noted on the right lower extremity.  Data Reviewed: Basic Metabolic Panel:  Recent Labs Lab 12/16/14 0417 12/17/14 0821 12/18/14 0425 12/19/14 0328 12/20/14 0308  NA 135 139 136 138 139  K  4.3 4.7 4.5 4.9 4.8  CL 101 102 98* 99* 103  CO2 GLUCOSE 131* 106* 148* 141* 141*  BUN 30* 31* 27* 26* 24*  CREATININE 1.62* 1.49* 1.37* 1.18 1.07  CALCIUM 8.3* 8.7* 8.5* 8.8* 8.4*  MG  --  2.1 2.1 2.1 2.0   Liver Function Tests:  Recent Labs Lab 12/19/14 0328  AST 39  ALT 13*  ALKPHOS 200*  BILITOT 2.3*  PROT 6.2*  ALBUMIN 1.8*   No results for input(s): LIPASE, AMYLASE in the last 168 hours.  Recent Labs Lab 12/16/14 1644  AMMONIA 37*   CBC:  Recent Labs Lab 12/14/14 0413 12/17/14 0821 12/18/14 0425 12/19/14 0328 12/20/14 0308  WBC 9.8 10.3 11.1* 11.1* 9.3  HGB 10.9* 11.5* 10.8* 11.4* 10.4*  HCT 37.7* 41.2 38.2* 40.2 37.2*  MCV 66.4* 69.5* 67.4* 67.3* 67.8*  PLT 275 295 291 332 257   Cardiac Enzymes: No results for input(s): CKTOTAL, CKMB, CKMBINDEX, TROPONINI in the last 168 hours. BNP (last 3 results)  Recent Labs  12/11/14 1845  BNP 1897.6*    ProBNP (last 3 results) No results for input(s): PROBNP in the last 8760 hours.  CBG:  Recent Labs Lab 12/19/14 0757 12/19/14 1206 12/19/14 1749 12/19/14 2145 12/20/14 0758  GLUCAP 126* 159* 153* 143* 233*    Recent Results (from the past 240 hour(s))  Culture, blood (routine x 2)     Status: None   Collection Time: 12/11/14  7:30 PM  Result Value Ref Range Status   Specimen Description BLOOD RIGHT ANTECUBITAL  Final   Special Requests BOTTLES DRAWN AEROBIC AND ANAEROBIC EACH  Final   Culture  Setup Time   Final    GRAM POSITIVE COCCI IN CLUSTERS ANAEROBIC BOTTLE ONLY CRITICAL RESULT CALLED TO, READ BACK BY AND VERIFIED WITH: B REAP RN [id number] 12/12/14 A BROWNING    Culture   Final    STAPHYLOCOCCUS SPECIES (COAGULASE NEGATIVE) THE SIGNIFICANCE OF ISOLATING THIS ORGANISM FROM A SINGLE SET OF BLOOD CULTURES WHEN MULTIPLE SETS ARE DRAWN IS UNCERTAIN. PLEASE NOTIFY THE MICROBIOLOGY DEPARTMENT WITHIN ONE WEEK IF SPECIATION AND SENSITIVITIES ARE REQUIRED. Performed at [facility]    Report Status 12/15/2014 FINAL  Final  Culture, blood (routine x 2)     Status: None   Collection Time: 12/11/14  8:08 PM  Result Value Ref Range Status   Specimen Description BLOOD LEFT ANTECUBITAL  Final   Special Requests BOTTLES DRAWN AEROBIC AND ANAEROBIC EACH  Final   Culture   Final    NO GROWTH 5 DAYS Performed at [facility]    Report Status 12/16/2014 FINAL  Final  MRSA PCR Screening     Status: None   Collection Time: 12/12/14  5:38 AM  Result Value Ref Range Status   MRSA by PCR NEGATIVE NEGATIVE Final    Comment:        The GeneXpert MRSA Assay (FDA approved for NASAL specimens only), is one component of a comprehensive MRSA colonization surveillance program. It is not intended to diagnose MRSA infection nor to guide or monitor treatment for MRSA infections.   Urine culture     Status: None   Collection Time: 12/12/14 12:45 PM  Result Value Ref Range Status   Specimen Description URINE, CLEAN CATCH  Final   Special Requests NONE  Final   Culture NO GROWTH 1 DAY  Final   Report Status 12/13/2014 FINAL  Final     Studies: No results found.  Scheduled Meds: . aztreonam  1 g Intravenous 3 times per day  . collagenase   Topical Daily  . docusate sodium  100 mg Oral BID  . famotidine  20 mg Oral BID  . feeding supplement (ENSURE ENLIVE)  237 mL Oral BID BM  . folic acid  1 mg Oral Daily  . furosemide  40 mg Intravenous BID  . heparin  5,000 Units Subcutaneous 3 times per day  . insulin aspart  0-9 Units Subcutaneous TID WC  . multivitamin with minerals  1 tablet Oral Daily  . thiamine  100 mg Oral Daily   Continuous Infusions:    Principal Problem:   Acute on chronic systolic heart failure (HCC) Active Problems:   Scrotal swelling   Acute encephalopathy   Sepsis (HCC)   Acute renal failure superimposed on stage 3 chronic kidney disease (HCC)   Hyponatremia   OSA (obstructive sleep apnea)   Cellulitis of leg, right    Anasarca   Cellulitis   Blood poisoning (HCC)   Rash and nonspecific skin eruption   Bipolar disorder (HCC)    Time spent: 40 mins    [facility] MD [facility] Pager [phone]. If 7PM-7AM, please contact night-coverage at [email or web address], password [facility] 12/20/2014, 9:23 AM  LOS: 8 days

## 2014-12-20 NOTE — Progress Notes (Signed)
placeed patient on BiPAP per MD's order.

## 2014-12-20 NOTE — Progress Notes (Signed)
PT Cancellation Note  Patient Details Name: Tom Reynolds MRN: [id number] DOB: [birth year]   Cancelled Treatment:    Reason Eval/Treat Not Completed: Medical issues which prohibited therapy.  Patient placed on BiPAP this am.  Will hold PT today.  Will return tomorrow for PT evaluation as appropriate for patient.   Vena Austria 12/20/2014, 2:18 PM Durenda Hurt. Renaldo Fiddler, [facility] Pager [phone]

## 2014-12-20 NOTE — Consult Note (Signed)
Patient ID: Tom Reynolds MRN: [id number], DOB/AGE: 51/23/1967   Admit date: 12/11/2014   Primary Physician: No primary care provider on file. Primary Cardiologist: [facility]  Pt. 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   Problem List  Past Medical History  Diagnosis Date  . Diabetes mellitus without complication (HCC)   . Hypertension   . CHF (congestive heart failure) (HCC)   . Bipolar disorder (HCC) 12/19/2014    History reviewed. No pertinent past surgical history.   Allergies  Allergies  Allergen Reactions  . Clindamycin/Lincomycin Rash  . Penicillins Other (See Comments)    intolerance    HPI  49 y/o male with h/o chronic systolic CHF, CKD, HTN, polysubstance abuse including h/o cocaine and alcohol use, Bipolar disorder and h/o medical noncompliance. Per records in Care Everywhere, he has been followed by [facility] and previously seen in their outpatient clinics.   He was recently admitted at [facility] 9/14-/11/28/14 for acute on chronic systolic CHF exacerbation. He initially presented with complaints of dyspnea, anasarca and scrotal edema. Per H&P from that admission, patient reported being diagnosed with CHF approximately 6 years ago. This was felt to be likely secondary to alcoholic cardiomypathy. He was being followed a local clinic but was recently placed in jail and he was dismissed from the clinic due to failure to follow-up.   Per records from [facility], cardiac enzymes were negative. 2D echo showed severe LV systolic dysfunction with an EF of 20-25% with severe global hypokenisis (consistent with prior studies). Moderate TR and Moderate pulmonary HTN were  also noted. He was placed on IV lasix for diuresis.  His scrotal edema was concerning for Fournier's gangrene and he was treated with vancomycin, clindamycin and Cipro, but ultimately  left the hospital AMA. He returned back to the ED on 9/29 with complaints of worsening dyspnea, increased scrotal edema and bilateral LEE. However, once again, he refused admission and left the ED in [facility].  He presented to the [facility] ED the following day on 12/12/14 with the same complaints. He has been admitted by Internal Medicine. He was initially felt to be septic secondary to bilateral lower extremity cellulitis and scrotal infection. He was placed on IV zosyn. Also in acute on chronic renal failure with bump in SCr from 1.26-->2.32. He remains in acute on chronic systolic CHF. 2D echo here at [facility] shows further reduction in EF, down to 15-20%, with moderate to severe MR and severely dilated LA. PA pressure is 41 mm Hg.  Urology consult was placed. CT of pelvis was negative for soft tissue gas or abscess. Urology feels the etiology of patient's scrotal swelling and cellulitis is secondary to poor hygiene.   On 12/19/14, Psychiatry was also consulted for medication management as patient has h/o bipolar disorder and became agitated and aggressive. They recommended PRN Haldol, Ativan and Valium. He has a Comptroller. No longer aggressive but slightly agitated. He is somnolent from medication, and unable to contribute to interview, thus unsure of current symptoms.   Telemetry shows sinus tach with a rate in the 110s. BP is stable.      Home Medications  Prior to Admission medications   Medication Sig Start Date End Date Taking? Authorizing Provider  aspirin 81 MG tablet Take 81 mg by mouth daily.   Yes Historical Provider, MD  omeprazole (PRILOSEC) 40 MG capsule Take 40 mg by mouth daily.   Yes Historical Provider, MD    Family History  History reviewed. No pertinent family history. -unable to question family h/o  CAD given patient's condition at time of assessment  Social History  Social History   Social History  . Marital Status: Divorced    Spouse Name: N/A  . Number of Children: N/A  . Years of Education: N/A   Occupational History  . Not on file.   Social History Main Topics  . Smoking status: Former Games developer  . Smokeless tobacco: Not on file  . Alcohol Use: No  . Drug Use: Not on file  . Sexual Activity: Not on file   Other Topics Concern  . Not on file   Social History Narrative     Review of Systems General:  No chills, fever, night sweats or weight changes.  Cardiovascular:  No chest pain, dyspnea on exertion, edema, orthopnea, palpitations, paroxysmal nocturnal dyspnea. Dermatological: No rash, lesions/masses Respiratory: No cough, dyspnea Urologic: No hematuria, dysuria Abdominal:   No nausea, vomiting, diarrhea, bright red blood per rectum, melena, or hematemesis Neurologic:  No visual changes, wkns, changes in mental status. All other systems reviewed and are otherwise negative except as noted above.  Physical Exam  Blood pressure 106/76, pulse 117, temperature 97.5 F (36.4 C), temperature source Axillary, resp. rate 12, height  (1.702 m), weight 276 lb 7.3 oz (125.4 kg), SpO2 100 %.  General: Pleasant, NAD Psych: Normal affect. Neuro: Alert and oriented X 3. Moves all extremities spontaneously. HEENT: Normal  Neck: Supple without bruits or JVD. Lungs:  Resp regular and unlabored, bilateral rhonchi. Heart: regular rhythm, tachy rate no s3, s4, or murmurs. Abdomen: Soft, non-tender, non-distended, BS + x 4.  Extremities: anasarca, bilateral LEE + weeping edema and cellulitis.  Labs  Troponin (Point of Care Test) No results for input(s): TROPIPOC in the last 72 hours. No results for input(s): CKTOTAL, CKMB, TROPONINI in the last 72 hours. Lab Results  Component Value Date   WBC 9.3 12/20/2014   HGB 10.4* 12/20/2014   HCT 37.2* 12/20/2014   MCV 67.8*  12/20/2014   PLT 257 12/20/2014    Recent Labs Lab 12/19/14 0328 12/20/14 0308  NA 138 139  K 4.9 4.8  CL 99* 103  CO2 25 25  BUN 26* 24*  CREATININE 1.18 1.07  CALCIUM 8.8* 8.4*  PROT 6.2*  --   BILITOT 2.3*  --   ALKPHOS 200*  --   ALT 13*  --   AST 39  --   GLUCOSE 141* 141*   No results found for: CHOL, HDL, LDLCALC, TRIG No results found for: DDIMER   Radiology/Studies  Dg Chest 2 View  12/11/2014   CLINICAL DATA:  Shortness of breath and scrotal swelling  EXAM: CHEST  2 VIEW  COMPARISON:  December 09, 2014  FINDINGS: The heart size is enlarged. The mediastinal contour is normal. There is no focal infiltrate, pulmonary edema, or pleural effusion. Degenerative joint changes of the spine are noted.  IMPRESSION: No active cardiopulmonary disease.  Cardiomegaly.   Electronically Signed   By: Sherian Rein M.D.   On: 12/11/2014 21:33   Ct Pelvis Wo Contrast  12/12/2014   CLINICAL DATA:  Sores and scrotal thickening. Evaluate for 48 gangrene.  EXAM: CT PELVIS WITHOUT CONTRAST  TECHNIQUE: Multidetector CT  imaging of the pelvis was performed following the standard protocol without intravenous contrast.  COMPARISON:  None.  FINDINGS: Diffuse, severe submucosal reticulation and skin thickening, including on the symptomatic scrotum. There is no soft tissue gas or evidence of fluid collection. No ulceration or bone infection.  Reactive appearing bilateral inguinal lymph node enlargement.  Small peritoneal fluid in the pelvis, likely from volume overload. Negative pelvic visceral.  Lower lumbar facet arthropathy.  IMPRESSION: Extensive anasarca, which could obscure superimposed cellulitis. No soft tissue gas or abscess.   Electronically Signed   By: Marnee Spring M.D.   On: 12/12/2014 00:48   US Scrotum  12/11/2014   CLINICAL DATA:  Scrotal swelling for several weeks  EXAM: SCROTAL ULTRASOUND  DOPPLER ULTRASOUND OF THE TESTICLES  TECHNIQUE: Complete ultrasound examination of the  testicles, epididymis, and other scrotal structures was performed. Color and spectral Doppler ultrasound were also utilized to evaluate blood flow to the testicles.  COMPARISON:  11/26/2014  FINDINGS: Right testicle  Measurements: 41 x 28 x 29 mm. No mass or microlithiasis visualized.  Left testicle  Measurements: 38 x 28 x 28 mm. No mass or microlithiasis visualized.  Right epididymis: Not able to be visualized. No indication of enlargement or hypervascularity.  Left epididymis: Not able to be visualized. No indication of enlargement or hypervascularity.  Hydrocele:  None visualized.  Varicocele:  None visualized.  Pulsed Doppler interrogation of both testes demonstrates normal low resistance arterial waveforms bilaterally. Detection and venous waveforms was more difficult, likely hindered by skin thickening.  Improved but still marked thickening of the scrotal wall diffusely, without evidence of collection or gas.  IMPRESSION: 1. Scrotal wall thickening, improved from 11/26/2014 but still marked. No fluid collection. 2. Negative testicles.   Electronically Signed   By: Marnee Spring M.D.   On: 12/11/2014 22:00   [place] Art/ven Flow Abd Pelv Doppler  12/11/2014   CLINICAL DATA:  Scrotal swelling for several weeks  EXAM: SCROTAL ULTRASOUND  DOPPLER ULTRASOUND OF THE TESTICLES  TECHNIQUE: Complete ultrasound examination of the testicles, epididymis, and other scrotal structures was performed. Color and spectral Doppler ultrasound were also utilized to evaluate blood flow to the testicles.  COMPARISON:  11/26/2014  FINDINGS: Right testicle  Measurements: 41 x 28 x 29 mm. No mass or microlithiasis visualized.  Left testicle  Measurements: 38 x 28 x 28 mm. No mass or microlithiasis visualized.  Right epididymis: Not able to be visualized. No indication of enlargement or hypervascularity.  Left epididymis: Not able to be visualized. No indication of enlargement or hypervascularity.  Hydrocele:  None visualized.   Varicocele:  None visualized.  Pulsed Doppler interrogation of both testes demonstrates normal low resistance arterial waveforms bilaterally. Detection and venous waveforms was more difficult, likely hindered by skin thickening.  Improved but still marked thickening of the scrotal wall diffusely, without evidence of collection or gas.  IMPRESSION: 1. Scrotal wall thickening, improved from 11/26/2014 but still marked. No fluid collection. 2. Negative testicles.   Electronically Signed   By: Marnee Spring M.D.   On: 12/11/2014 22:00    ECG  Sinus tach 105 bpm   Echocardiogram 12/19/14  Study Conclusions  - Left ventricle: The cavity size was severely dilated. Wall thickness was normal. The estimated ejection fraction was in the range of 15% to 20%. - Mitral valve: There was moderate to severe regurgitation. - Left atrium: The atrium was severely dilated. - Right ventricle: The cavity size was moderately dilated. Systolic function was severely reduced. -  Right atrium: The atrium was moderately dilated. - Pulmonic valve: There was moderate regurgitation. - Pulmonary arteries: PA peak pressure: 41 mm Hg (S).    ASSESSMENT AND PLAN  Principal Problem:   Acute on chronic systolic heart failure (HCC) Active Problems:   Sepsis (HCC)   Acute renal failure superimposed on stage 3 chronic kidney disease (HCC)   Hyponatremia   OSA (obstructive sleep apnea)   Cellulitis of leg, right   Anasarca   Cellulitis   Scrotal swelling   Blood poisoning (HCC)   Rash and nonspecific skin eruption   Acute encephalopathy   Bipolar disorder (HCC)  1. Acute on Chronic Systolic CHF: EF 16-10%, felt to be secondary to alcoholic cardiomyopathy. Acute exacerbation likely secondary to medical noncompliance. He was initially admitted with a/c renal failure, however renal function has improved. SCr is 1.07 today and K is stable. Continue IV lasix, strict I/Os, daily weights and low sodium diet. Consider  restarting ACE-I. With elevated HR and low EF, consider adding BB. He does have a h/o of cocaine abuse, but may consider low dose Coreg. However, long term management of his CHF will be difficult given his long history of medical noncompliance. Consider social work consult.    SignedRobbie Lis, PA-C 12/20/2014, 9:46 AM   Attending Note Patient seen and discussed with PA Sharol Harness, I agree with her documentation. 49 yo male history of chronic systolic HF LVEF 96-04%, DM2, HTN, polysubstance bause, admitted initially 12/12/14 sepsis secondary to cellulitis, AKI on CKD, and acute on chronic systolic HF. He is followed by a cardiologist at [facility]. Medical therapy has been complicated by poor compliance and polysubstance abuse.  According to his I/Os he is + 3. 8 liters since admission,  No recent weights.   CXR stable cardiomegaly, moderate pulm edema, bilaeral small pleural effusions Mg 2, Hgb 10.4, Plt 257, Cr 1.07, BUN 24, BNP 2510(up from 1900 9 days ago), TSH 9,  EKG SR  For his acute on chronic systolic HF, he is currently on lasix IV  bid. Will follow I/Os. Would not start beta blocker at this time in setting of decompensation. Cocaine interaction with beta blockers remains primarily theoretical, and would consider low dose coreg once more euvolemic. Given recent AKI would not start ACE-I today, follow Cr with diuresis. Continue bipap, he is breathing comfortably currently. Place nitropaste in setting of pulm edema.  Hypothyroidism, further testing per primary team.    Dominga Ferry MD

## 2014-12-21 DIAGNOSIS — E0781 Sick-euthyroid syndrome: Secondary | ICD-10-CM

## 2014-12-21 LAB — CBC
HEMATOCRIT: 40.3 % (ref 39.0–52.0)
HEMOGLOBIN: 11 g/dL — AB (ref 13.0–17.0)
MCH: 19 pg — ABNORMAL LOW (ref 26.0–34.0)
MCHC: 27.3 g/dL — ABNORMAL LOW (ref 30.0–36.0)
MCV: 69.7 fL — AB (ref 78.0–100.0)
PLATELETS: 292 10*3/uL (ref 150–400)
RBC: 5.78 MIL/uL (ref 4.22–5.81)
RDW: 22.6 % — ABNORMAL HIGH (ref 11.5–15.5)
WBC: 12.2 10*3/uL — ABNORMAL HIGH (ref 4.0–10.5)

## 2014-12-21 LAB — GLUCOSE, CAPILLARY
GLUCOSE-CAPILLARY: 162 mg/dL — AB (ref 65–99)
Glucose-Capillary: 115 mg/dL — ABNORMAL HIGH (ref 65–99)
Glucose-Capillary: 161 mg/dL — ABNORMAL HIGH (ref 65–99)
Glucose-Capillary: 161 mg/dL — ABNORMAL HIGH (ref 65–99)

## 2014-12-21 LAB — BASIC METABOLIC PANEL
ANION GAP: 13 (ref 5–15)
BUN: 23 mg/dL — ABNORMAL HIGH (ref 6–20)
CALCIUM: 8.3 mg/dL — AB (ref 8.9–10.3)
CHLORIDE: 101 mmol/L (ref 101–111)
CO2: 23 mmol/L (ref 22–32)
Creatinine, Ser: 1.1 mg/dL (ref 0.61–1.24)
GFR calc non Af Amer: >60 mL/min (ref >60)
GLUCOSE: 141 mg/dL — AB (ref 65–99)
POTASSIUM: 4.2 mmol/L (ref 3.5–5.1)
Sodium: 137 mmol/L (ref 135–145)

## 2014-12-21 LAB — BRAIN NATRIURETIC PEPTIDE: B Natriuretic Peptide: 1741.5 pg/mL — ABNORMAL HIGH (ref 0.0–100.0)

## 2014-12-21 MED ORDER — FUROSEMIDE 10 MG/ML IJ SOLN
20.0000 mg | Freq: Once | INTRAMUSCULAR | Status: AC
  Administered 2014-12-21: 20 mg via INTRAVENOUS

## 2014-12-21 MED ORDER — FUROSEMIDE 10 MG/ML IJ SOLN
INTRAMUSCULAR | Status: AC
  Filled 2014-12-21: qty 2

## 2014-12-21 MED ORDER — FUROSEMIDE 10 MG/ML IJ SOLN
60.0000 mg | Freq: Two times a day (BID) | INTRAMUSCULAR | Status: DC
  Administered 2014-12-21 – 2014-12-22 (×3): 60 mg via INTRAVENOUS
  Filled 2014-12-21 (×3): qty 6

## 2014-12-21 NOTE — Progress Notes (Signed)
Vital PT Cancellation Note  Patient Details Name: Tom Reynolds MRN: [id number] DOB: Apr 18, 1965   Cancelled Treatment:    Reason Eval/Treat Not Completed: Medical issues which prohibited therapy.  Patient agitated today and RN requested PT defer session today.  Will check back tomorrow.   Olivia Canter 12/21/2014, 11:08 AM  12/21/2014 Corlis Hove, PT [phone]

## 2014-12-21 NOTE — Progress Notes (Signed)
Patient ID: Tom Reynolds, male   DOB: 1965/12/08, 49 y.o.   MRN: [id number]     Subjective:   +SOB  Objective:   Temp:  [97.2 F (36.2 C)-98.1 F (36.7 C)] 97.8 F (36.6 C) (10/09 0739) Pulse Rate:  [102-123] 108 (10/09 0739) Resp:  [0-26] 17 (10/09 0739) BP: (105-120)/(68-92) 118/87 mmHg (10/09 0739) SpO2:  [99 %-100 %] 100 % (10/09 0739) FiO2 (%):  [40 %] 40 % (10/09 0739) Weight:  [275 lb 2.2 oz (124.8 kg)] 275 lb 2.2 oz (124.8 kg) (10/09 0500) Last BM Date: 12/21/14  Filed Weights   12/11/14 1825 12/12/14 0532 12/21/14 0500  Weight: 276 lb 3 oz (125.278 kg) 276 lb 7.3 oz (125.4 kg) 275 lb 2.2 oz (124.8 kg)    Intake/Output Summary (Last 24 hours) at 12/21/14 0741 Last data filed at 12/21/14 0500  Gross per 24 hour  Intake    150 ml  Output   1525 ml  Net  -1375 ml    Telemetry: sinus tach  Exam:  General: NAD  Resp: coarse bilaterally  Cardiac: regular, rate 105, no m/r/g, cannot see JVD due to neck girth  GI: abdomen soft, NT, ND  MSK: 2+ bilateral LE edema  Neuro: no focal deficits  Psych: appropriate affect  Lab Results:  Basic Metabolic Panel:  Recent Labs Lab 12/18/14 0425 12/19/14 0328 12/20/14 0308  NA 136 138 139  K 4.5 4.9 4.8  CL 98* 99* 103  CO2 GLUCOSE 148* 141* 141*  BUN 27* 26* 24*  CREATININE 1.37* 1.18 1.07  CALCIUM 8.5* 8.8* 8.4*  MG 2.1 2.1 2.0    Liver Function Tests:  Recent Labs Lab 12/19/14 0328  AST 39  ALT 13*  ALKPHOS 200*  BILITOT 2.3*  PROT 6.2*  ALBUMIN 1.8*    CBC:  Recent Labs Lab 12/18/14 0425 12/19/14 0328 12/20/14 0308  WBC 11.1* 11.1* 9.3  HGB 10.8* 11.4* 10.4*  HCT 38.2* 40.2 37.2*  MCV 67.4* 67.3* 67.8*  PLT 291 332 257    Cardiac Enzymes:  Recent Labs Lab 12/20/14 0903 12/20/14 1650 12/20/14 1859  TROPONINI 0.03 0.03 0.04*    BNP: No results for input(s): PROBNP in the last 8760 hours.  Coagulation: No results for input(s): INR in the last 168  hours.  ECG:   Medications:   Scheduled Medications: . aztreonam  1 g Intravenous 3 times per day  . collagenase   Topical Daily  . docusate sodium  100 mg Oral BID  . famotidine  20 mg Oral BID  . feeding supplement (ENSURE ENLIVE)  237 mL Oral BID BM  . folic acid  1 mg Oral Daily  . furosemide  40 mg Intravenous BID  . heparin  5,000 Units Subcutaneous 3 times per day  . insulin aspart  0-9 Units Subcutaneous TID WC  . multivitamin with minerals  1 tablet Oral Daily  . thiamine  100 mg Oral Daily     Infusions:     PRN Medications:  acetaminophen **OR** acetaminophen, diazepam, levalbuterol, LORazepam, morphine injection     Assessment/Plan    1. Acute on chronic systolic HF - echo 12/2014 LVEF 15-20%, mod to severe MR, severe RV dysfunction - he is normally followed at [facility], historically management has been complicated by poor compliance and polysubstance abuse - negative 1.4 liters yesterday, labs are pending this AM. He is on lasix  IV bid.  - no beta blocker due to acute  decompensation, despite cocaine use I would consider long term use once compensated as interaction remains primarily theoretical and there is clear benefit of beta blocker therapy in systolic HF - continue IV diuretics today. Still very volume overloaded, will increase lasix to  bid IVD, goal would be net negative 2-3 liters per day.        Dina Rich, M.D.

## 2014-12-21 NOTE — Progress Notes (Signed)
[facility] PROGRESS NOTE  Tom Reynolds ZOX:[id number] DOB: 1965-06-25 DOA: 12/11/2014 PCP: No primary care provider on file.  Assessment/Plan:  #1 history of cardiomyopathy/acute on chronic systolic heart failure with EF of 15-20%per 2 d echo 12/19/2014 Patient not on any heart failure medications in the outpatient setting. Patient noted to be in acute on chronic heart failure with anasarca and now complains of shortness of breath this morning. Patient sounds wet on physical exam. Chest x-ray consistent with pulmonary edema. Cardiac enzymes slightly elevated however likely secondary to volume overload. 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 Patient early on in the hospitalization was in acute renal failure and a such ACE inhibitor was discontinued. 2-D echo done on 12/19/2014 with a EF of 15-20% which is worse than prior EF of 30%. Patient with moderate to severe mitral valvular regurgitation, severely dilated left atrium, moderately dilated right ventricle and severely reduced right ventricular systolic function. Moderately dilated right atrium. Moderate pulmonary valvular regurgitation. Patient was given Lasix 20 mg IV 1, 2 days ago with 1.025 L urine output. Patient's renal function has improved and as such was place on Lasix 40 mg IV every 12 hours yesterday. Patient was -1.375 L over the past 24 hours. Strict I's and O's. Daily weights.CXR. Will hold off on beta blocker at this time due to acute decompensation. Continue to hold off on ACE inhibitor due to recent acute renal failure during this hospitalization. Lasix dose has been increased to 60 mg IV every 12 hours with a goal of net -2-3 L per day per cardiology. Patient was placed on the BiPAP yesterday and resting comfortably. Trial off BiPAP today. Cardiology following and appreciate input and recommendations.   #2 anasarca Questionable etiology. Some improvement with diuresis, however  still significantly volume overloaded. Patient does have a low albumen. Patient with normal LFTs. Patient however with prior history of cardiomyopathy and currently in acute on chronic systolic heart failure. Patient with complaints of shortness of breath. 2-D echo was done on 12/19/2014 with a depressed ejection fraction of 15-20% worse than prior EF of 30%. Patient also noted to have moderate to severe mitral valvular regurgitation, severely dilated left atrium moderately dilated right ventricle, severely reduced right ventricular systolic function, moderately dilated right atrium and moderate pulmonary valvular regurgitation. Patient was given a dose of Lasix 20 mg IV 1 with 1.025 L of urine output. Blood pressure currently stable. Patient is -1.375 L over the past 24 hours after being started on IV Lasix. Lasix dose has been increased per cardiology.   #3 sepsis Patient was admitted with sepsis felt to be secondary to bilateral lower extremity cellulitis, scrotal cellulitis. On admission patient had elevated lactic acid level of 3.83 and was in acute on chronic renal failure with a creatinine going up as high as 2.32 on admission from a baseline of 1.26. Patient was pancultured. Urine cultures were negative. Blood cultures with 1/2 coag neg. staph likely a contaminant. Patient has been seen in consultation by urology recommend antibiotic and wound care treatment with outpatient follow-up. Patient was transitioned to IV clindamycin, however developed a rash and subsequently has been transitioned back to IV Azactam. Continue Azactam for now. Saline lock IV fluids. Follow.  #4 scrotal swelling and cellulitis/ LE cellulitis Patient currently afebrile. WBC has trended down. Patient still with significant scrotal swelling. Patient has been seen in consultation by urology will feel the etiology of patient's swelling and cellulitis is secondary to poor  hygiene. CT pelvis was negative for soft tissue gas or  abscess. Patient status post Foley catheter placement with good output. KVO IV fluids. Patient was initially on IV vancomycin and IV Azactam and subsequently transitioned to IV clindamycin however developed a rash to his back, and now on IV Azactam. Scrotal elevation. Continue wound care dressings per wound care nurse and recommendations. Will likely need follow-up with urology as outpatient.  #5 acute encephalopathy Likely multifactorial secondary to alcohol withdrawal and current infection. Patient less agitated after changing Ativan to Valium for the withdrawal protocol. Dilaudid has been discontinued. QTC is normal. At family's insistence yesterday Haldol has been discontinued. Patient was aggressive and had to be given some Haldol 3 days ago. Patient does state he has a prior history of bipolar disorder which was verified on care everywhere however currently on no medications. Patient states supposed to be on Zoloft 200 mg daily. Patient has been seen in consultation by psychiatry and Haldol dose was been decreased to 3 g IV every 6 hours as needed, Ativan 1 mg every 6 hours as needed for restlessness and agitation and to continue Valium 5 mg IV as needed for uncontrollable anxiety/panic attacks per psych recommendations. At family's insistence Haldol has been discontinued. Psychiatry following and appreciate input and recommendations.  #6 acute renal failure chronic kidney disease stage II Baseline renal function is 1.26. On admission patient was up to 2.32. Labs pending this morning. Patient however also noted to be volume loaded on examination. Renal function has improved with gentle hydration. NSL IV fluids. Patient with urine output of 1.5 L yesterday after being started on diuretics. Continue to hold nephrotoxic agents. Continue IV Lasix. Labs pending.  #7 hyponatremia Resolved.  #8 ETOH abuse Patient likely going through withdrawal with agitation. Per nursing improvement with agitation with  change from ativan to valium on withdrawal protocol. Thiamine. Folic acid. Follow.  #9 bipolar disorder Patient with a history of bipolar disorder per patient and verified on care everywhere from her PCPs note. Patient states was on Zoloft 200 mg daily. Patient has had some agitation and aggressive behavior during this hospitalization. Patient has been seen in consultation by psychiatry who recommended continuing safety sitter. Haldol has been discontinued yesterday at family's insistence. Ativan 1 mg every 6 hours as needed for restlessness and agitation. Was recommended to continue Valium 5 mg IV as needed for uncontrollable anxiety/panic attacks. Appreciate psychiatric input.   #10 gastroesophageal reflux disease Pepcid.  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 Will need repeat thyroid function studies in about 4-6 weeks post discharge.  #12 prophylaxis Pepcid for GI prophylaxis. Heparin for DVT prophylaxis.   Code Status: Full Family Communication: No family at bedside. Updated sisters yesterday Disposition Plan: Remain the step down unit   Consultants:  Urology: Dr. Neva Seat 12/12/2014  Psychiatry: Dr. Elsie Saas 12/19/2014  Cardiology: Dr. Wyline Mood 12/20/2014  Procedures:  CT pelvis 12/12/2014  Chest x-ray 12/11/2014  2-D echo 12/19/2014  Antibiotics:  IV ciprofloxacin 12/11/2014>>> ninth 29 2016  IV clindamycin 12/11/2014>>> 12/11/2014  IV clindamycin 12/16/2014>>>> 12/17/2014  IV Azactam 12/12/2014>>> 12/15/2014  IV Azactam 12/17/2014  IV vancomycin 12/11/2014>>>> 12/15/2014  HPI/Subjective: Patient on BiPAP. Resting comfortably. Patient opens eyes to verbal stimuli still with complaints of shortness of breath. Patient less agitated.  Objective: Filed Vitals:   12/21/14 0800  BP: 118/87  Pulse: 124  Temp:   Resp:     Intake/Output Summary (Last 24 hours) at 12/21/14 0918 Last  data filed at 12/21/14 0500   Gross per 24 hour  Intake    150 ml  Output   1525 ml  Net  -1375 ml   Filed Weights   12/11/14 1825 12/12/14 0532 12/21/14 0500  Weight: 125.278 kg (276 lb 3 oz) 125.4 kg (276 lb 7.3 oz) 124.8 kg (275 lb 2.2 oz)    Exam:   General:  On Bipap.  Cardiovascular: Tachycardia with 3/6 SEM  Respiratory: Diffuse rhonchi and crackles. No wheezing.  Abdomen: Soft, slight distention, positive bowel sounds, no rebound, no guarding. Rash noted on the back in the lower torso improving. scrotal wound with eschar. Scrotal swelling  Musculoskeletal: No clubbing or cyanosis. 2-3+ bilateral lower extremity edema up to mid abdomen. Right lower extremity with decreased erythema, wounds ( 2.5 x 2.5 , and 1.5 x 1.5cm) with sloughed/eschar minimal amount of yellow drainage noted on the right lower extremity.  Data Reviewed: Basic Metabolic Panel:  Recent Labs Lab 12/16/14 0417 12/17/14 0821 12/18/14 0425 12/19/14 0328 12/20/14 0308  NA 135 139 136 138 139  K 4.3 4.7 4.5 4.9 4.8  CL 101 102 98* 99* 103  CO2 24 23 25 25 25   GLUCOSE 131* 106* 148* 141* 141*  BUN 30* 31* 27* 26* 24*  CREATININE 1.62* 1.49* 1.37* 1.18 1.07  CALCIUM 8.3* 8.7* 8.5* 8.8* 8.4*  MG  --  2.1 2.1 2.1 2.0   Liver Function Tests:  Recent Labs Lab 12/19/14 0328  AST 39  ALT 13*  ALKPHOS 200*  BILITOT 2.3*  PROT 6.2*  ALBUMIN 1.8*   No results for input(s): LIPASE, AMYLASE in the last 168 hours.  Recent Labs Lab 12/16/14 1644  AMMONIA 37*   CBC:  Recent Labs Lab 12/17/14 0821 12/18/14 0425 12/19/14 0328 12/20/14 0308  WBC 10.3 11.1* 11.1* 9.3  HGB 11.5* 10.8* 11.4* 10.4*  HCT 41.2 38.2* 40.2 37.2*  MCV 69.5* 67.4* 67.3* 67.8*  PLT 295 291 332 257   Cardiac Enzymes:  Recent Labs Lab 12/20/14 0903 12/20/14 1650 12/20/14 1859  TROPONINI 0.03 0.03 0.04*   BNP (last 3 results)  Recent Labs  12/11/14 1845 12/20/14 0903  BNP 1897.6* 2510.0*    ProBNP (last 3 results) No results for  input(s): PROBNP in the last 8760 hours.  CBG:  Recent Labs Lab 12/19/14 2145 12/20/14 0758 12/20/14 1254 12/20/14 1744 12/20/14 2249  GLUCAP 143* 233* 168* 131* 141*    Recent Results (from the past 240 hour(s))  Culture, blood (routine x 2)     Status: None   Collection Time: 12/11/14  7:30 PM  Result Value Ref Range Status   Specimen Description BLOOD RIGHT ANTECUBITAL  Final   Special Requests BOTTLES DRAWN AEROBIC AND ANAEROBIC EACH  Final   Culture  Setup Time   Final    GRAM POSITIVE COCCI IN CLUSTERS ANAEROBIC BOTTLE ONLY CRITICAL RESULT CALLED TO, READ BACK BY AND VERIFIED WITH: B REAP RN [id number] 12/12/14 A BROWNING    Culture   Final    STAPHYLOCOCCUS SPECIES (COAGULASE NEGATIVE) THE SIGNIFICANCE OF ISOLATING THIS ORGANISM FROM A SINGLE SET OF BLOOD CULTURES WHEN MULTIPLE SETS ARE DRAWN IS UNCERTAIN. PLEASE NOTIFY THE MICROBIOLOGY DEPARTMENT WITHIN ONE WEEK IF SPECIATION AND SENSITIVITIES ARE REQUIRED. Performed at [facility]    Report Status 12/15/2014 FINAL  Final  Culture, blood (routine x 2)     Status: None   Collection Time: 12/11/14  8:08 PM  Result Value Ref Range Status  Specimen Description BLOOD LEFT ANTECUBITAL  Final   Special Requests BOTTLES DRAWN AEROBIC AND ANAEROBIC EACH  Final   Culture   Final    NO GROWTH 5 DAYS Performed at [facility]    Report Status 12/16/2014 FINAL  Final  MRSA PCR Screening     Status: None   Collection Time: 12/12/14  5:38 AM  Result Value Ref Range Status   MRSA by PCR NEGATIVE NEGATIVE Final    Comment:        The GeneXpert MRSA Assay (FDA approved for NASAL specimens only), is one component of a comprehensive MRSA colonization surveillance program. It is not intended to diagnose MRSA infection nor to guide or monitor treatment for MRSA infections.   Urine culture     Status: None   Collection Time: 12/12/14 12:45 PM  Result Value Ref Range Status   Specimen Description URINE,  CLEAN CATCH  Final   Special Requests NONE  Final   Culture NO GROWTH 1 DAY  Final   Report Status 12/13/2014 FINAL  Final     Studies: Dg Chest Port 1 View  12/20/2014   CLINICAL DATA:  Shortness of breath.  Fluid overload.  EXAM: PORTABLE CHEST 1 VIEW  COMPARISON:  12/11/2014 chest radiograph.  FINDINGS: Stable cardiomediastinal silhouette with moderate cardiomegaly. Slightly low lung volumes. No pneumothorax. Small bilateral pleural effusions, increased bilaterally. There is moderate pulmonary edema. Bibasilar hazy opacities, favor atelectasis.  IMPRESSION: 1. Stable moderate cardiomegaly with moderate pulmonary edema, in keeping with moderate congestive heart failure. 2. Increased bilateral small pleural effusions with associated bibasilar lung opacities likely representing atelectasis.   Electronically Signed   By: Delbert Phenix M.D.   On: 12/20/2014 10:03    Scheduled Meds: . aztreonam  1 g Intravenous 3 times per day  . collagenase   Topical Daily  . docusate sodium  100 mg Oral BID  . famotidine  20 mg Oral BID  . feeding supplement (ENSURE ENLIVE)  237 mL Oral BID BM  . folic acid  1 mg Oral Daily  . furosemide      . furosemide  60 mg Intravenous BID  . heparin  5,000 Units Subcutaneous 3 times per day  . insulin aspart  0-9 Units Subcutaneous TID WC  . multivitamin with minerals  1 tablet Oral Daily  . thiamine  100 mg Oral Daily   Continuous Infusions:    Principal Problem:   Acute on chronic systolic heart failure (HCC) Active Problems:   Scrotal swelling   Acute encephalopathy   Sepsis (HCC)   Acute renal failure superimposed on stage 3 chronic kidney disease (HCC)   Hyponatremia   OSA (obstructive sleep apnea)   Cellulitis of leg, right   Anasarca   Cellulitis   Blood poisoning (HCC)   Rash and nonspecific skin eruption   Bipolar disorder (HCC)   Euthyroid sick syndrome    Time spent: 40 mins    [facility] MD [facility] Pager [phone].  If 7PM-7AM, please contact night-coverage at [email or web address], password [facility] 12/21/2014, 9:18 AM  LOS: 9 days

## 2014-12-22 ENCOUNTER — Inpatient Hospital Stay (HOSPITAL_COMMUNITY): Payer: Medicaid Other

## 2014-12-22 DIAGNOSIS — F312 Bipolar disorder, current episode manic severe with psychotic features: Secondary | ICD-10-CM

## 2014-12-22 DIAGNOSIS — L03119 Cellulitis of unspecified part of limb: Secondary | ICD-10-CM

## 2014-12-22 DIAGNOSIS — Z515 Encounter for palliative care: Secondary | ICD-10-CM

## 2014-12-22 DIAGNOSIS — I5023 Acute on chronic systolic (congestive) heart failure: Secondary | ICD-10-CM

## 2014-12-22 LAB — CBC
HEMATOCRIT: 38.9 % — AB (ref 39.0–52.0)
Hemoglobin: 10.5 g/dL — ABNORMAL LOW (ref 13.0–17.0)
MCH: 18.8 pg — ABNORMAL LOW (ref 26.0–34.0)
MCHC: 27 g/dL — AB (ref 30.0–36.0)
MCV: 69.5 fL — ABNORMAL LOW (ref 78.0–100.0)
PLATELETS: 281 10*3/uL (ref 150–400)
RBC: 5.6 MIL/uL (ref 4.22–5.81)
RDW: 22.9 % — AB (ref 11.5–15.5)
WBC: 10.1 10*3/uL (ref 4.0–10.5)

## 2014-12-22 LAB — BASIC METABOLIC PANEL
Anion gap: 11 (ref 5–15)
BUN: 21 mg/dL — ABNORMAL HIGH (ref 6–20)
CALCIUM: 8.3 mg/dL — AB (ref 8.9–10.3)
CO2: 33 mmol/L — AB (ref 22–32)
CREATININE: 1.09 mg/dL (ref 0.61–1.24)
Chloride: 97 mmol/L — ABNORMAL LOW (ref 101–111)
Glucose, Bld: 142 mg/dL — ABNORMAL HIGH (ref 65–99)
Potassium: 4.2 mmol/L (ref 3.5–5.1)
Sodium: 141 mmol/L (ref 135–145)

## 2014-12-22 LAB — BRAIN NATRIURETIC PEPTIDE: B Natriuretic Peptide: 1896 pg/mL — ABNORMAL HIGH (ref 0.0–100.0)

## 2014-12-22 LAB — T3, FREE: T3 FREE: 1.7 pg/mL — AB (ref 2.0–4.4)

## 2014-12-22 LAB — GLUCOSE, CAPILLARY
GLUCOSE-CAPILLARY: 148 mg/dL — AB (ref 65–99)
GLUCOSE-CAPILLARY: 105 mg/dL — AB (ref 65–99)
Glucose-Capillary: 181 mg/dL — ABNORMAL HIGH (ref 65–99)
Glucose-Capillary: 143 mg/dL — ABNORMAL HIGH (ref 65–99)
Glucose-Capillary: 127 mg/dL — ABNORMAL HIGH (ref 65–99)

## 2014-12-22 LAB — T3: T3 TOTAL: 77 ng/dL (ref 71–180)

## 2014-12-22 MED ORDER — LORAZEPAM 2 MG/ML IJ SOLN
1.0000 mg | Freq: Once | INTRAMUSCULAR | Status: AC
  Administered 2014-12-22: 1 mg via INTRAMUSCULAR
  Filled 2014-12-22: qty 1

## 2014-12-22 NOTE — Consult Note (Signed)
[place] Psychiatry Consult   Reason for Consult:  Capacity evaluation Referring Physician:  Dr. Grandville Silos Patient Identification: Tom Reynolds MRN:  [id number] Principal Diagnosis: Acute on chronic systolic heart failure [facility]) Diagnosis:   Patient Active Problem List   Diagnosis Date Noted  . Euthyroid sick syndrome [E07.81] 12/21/2014  . Bipolar disorder ([place]) [F31.9] 12/19/2014  . Rash and nonspecific skin eruption [R21] 12/17/2014  . Acute encephalopathy [G93.40] 12/17/2014  . Sepsis ([place]) [A41.9] 12/12/2014  . Acute renal failure superimposed on stage 3 chronic kidney disease ([place]) [N17.9, N18.3] 12/12/2014  . Acute on chronic systolic heart failure ([place]) [I50.23] 12/12/2014  . Hyponatremia [E87.1] 12/12/2014  . OSA (obstructive sleep apnea) [G47.33] 12/12/2014  . Cellulitis of leg, right [L03.115] 12/12/2014  . Anasarca [R60.1]   . Cellulitis [L03.90]   . Scrotal swelling [N50.89]   . Blood poisoning ([place]) [A41.9]     Total Time spent with patient: 1 hour  Subjective:   Tom Reynolds is a 49 y.o. male patient admitted with sepsis.  HPI:  Tom Reynolds is a 49 years old male seen, chart reviewed for face-to-face psychiatric consultation and evaluation of capacity evaluation to make his own medical decisions. Patient has been suffering with significant medical and mental health problems and also known polysubstance abuse versus dependence. Patient admitted to [facility] with sepsis, anasarca and cellulitis. Patient has been noncompliant with medical care over several years and has poor insight and judgment about his own multiple medical conditions and required treatment needs. Psychiatric consultation requested for capacity evaluation as patient asking to be discharged when medically not stable to be discharged. Patient has slurred speech or talking loud. Patient has oriented to his name, date of birth and current date, month and year. He also knows he is in the [facility]. Patient could not talk about his current medical conditions and needed treatment instead of that he becomes frustrated, angry, using foul language and threatening to hurt with body language.   He has exhibited symptoms delirium, agitation and aggressive behaviors. Patient has been irritable, agitated, yelling and screaming and within few minutes he has been drowsy and sleepy in his bed. Patient has soft restraints to prevent hurting himself and others due to his agitation. Patient has history of bipolar disorder and noncompliant with outpatient medication management  and reportedly taking Zoloft according to outpatient medical records. Patient sister stated that about a year ago his mother passed away secondary to heart problems since then he had increased drinking alcohol and also using crack cocaine. His last use of alcohol about 7-8 months ago and crack cocaine about a month ago. Patient has been staying with the friend of the family but not able to care for himself. Patient also appeared to being delirious condition as described above.  Past Psychiatric History: He has polysubstance abuse versus dependence without treatment history. Patient has no acute psychiatric hospitalizations even though he was diagnosed with mood disorder and received a Zoloft as per the primary care physician office notes. patient has no outpatient psychiatric providers.   Risk to Self: Is patient at risk for suicide?: No Risk to Others:   Prior Inpatient Therapy:   Prior Outpatient Therapy:    Past Medical History:  Past Medical History  Diagnosis Date  . Diabetes mellitus without complication ([place])   . Hypertension   . CHF (congestive heart failure) ([place])   . Bipolar disorder ([place]) 12/19/2014   History reviewed. No pertinent past surgical history. Family History:  History reviewed. No pertinent family history.   Family Psychiatric  History: Patient is one of the 8 siblings. His mother passed away about  a year ago secondary to heart problems and he has 5 siblings living and supportive to each other. No family history of mental illness or substance abuse.  Social History:  History  Alcohol Use No     History  Drug Use Not on file    Social History   Social History  . Marital Status: Divorced    Spouse Name: N/A  . Number of Children: N/A  . Years of Education: N/A   Social History Main Topics  . Smoking status: Former Research scientist (life sciences)  . Smokeless tobacco: None  . Alcohol Use: No  . Drug Use: None  . Sexual Activity: Not Asked   Other Topics Concern  . None   Social History Narrative   Additional Social History:                          Allergies:   Allergies  Allergen Reactions  . Clindamycin/Lincomycin Rash  . Penicillins Other (See Comments)    intolerance    Labs:  Results for orders placed or performed during the hospital encounter of 12/11/14 (from the past 48 hour(s))  Glucose, capillary     Status: Abnormal   Collection Time: 12/20/14 12:54 PM  Result Value Ref Range   Glucose-Capillary 168 (H) 65 - 99 mg/dL  Troponin I (q 6hr x 3)     Status: None   Collection Time: 12/20/14  4:50 PM  Result Value Ref Range   Troponin I 0.03 <0.031 ng/mL    Comment:        NO INDICATION OF MYOCARDIAL INJURY.   Glucose, capillary     Status: Abnormal   Collection Time: 12/20/14  5:44 PM  Result Value Ref Range   Glucose-Capillary 131 (H) 65 - 99 mg/dL  Troponin I (q 6hr x 3)     Status: Abnormal   Collection Time: 12/20/14  6:59 PM  Result Value Ref Range   Troponin I 0.04 (H) <0.031 ng/mL    Comment:        PERSISTENTLY INCREASED TROPONIN VALUES IN THE RANGE OF 0.04-0.49 ng/mL CAN BE SEEN IN:       -UNSTABLE ANGINA       -CONGESTIVE HEART FAILURE       -MYOCARDITIS       -CHEST TRAUMA       -ARRYHTHMIAS       -LATE PRESENTING MYOCARDIAL INFARCTION       -COPD   CLINICAL FOLLOW-UP RECOMMENDED.   T4, free     Status: None   Collection Time: 12/20/14   6:59 PM  Result Value Ref Range   Free T4 0.93 0.61 - 1.12 ng/dL  T3, free     Status: Abnormal   Collection Time: 12/20/14  6:59 PM  Result Value Ref Range   T3, Free 1.7 (L) 2.0 - 4.4 pg/mL    Comment: (NOTE) Performed At: [facility] [place], [place] [id number] Lindon Romp MD [id number]   T3     Status: None   Collection Time: 12/20/14  6:59 PM  Result Value Ref Range   T3, Total 77 71 - 180 ng/dL    Comment: (NOTE) Performed At: [facility] [place], [place] [id number] Lindon Romp MD [id number]   Glucose, capillary  Status: Abnormal   Collection Time: 12/20/14 10:49 PM  Result Value Ref Range   Glucose-Capillary 141 (H) 65 - 99 mg/dL  Glucose, capillary     Status: Abnormal   Collection Time: 12/21/14  7:37 AM  Result Value Ref Range   Glucose-Capillary 115 (H) 65 - 99 mg/dL  Basic metabolic panel     Status: Abnormal   Collection Time: 12/21/14 10:55 AM  Result Value Ref Range   Sodium 137 135 - 145 mmol/L   Potassium 4.2 3.5 - 5.1 mmol/L   Chloride 101 101 - 111 mmol/L   CO2 23 22 - 32 mmol/L   Glucose, Bld 141 (H) 65 - 99 mg/dL   BUN 23 (H) 6 - 20 mg/dL   Creatinine, Ser 1.10 0.61 - 1.24 mg/dL   Calcium 8.3 (L) 8.9 - 10.3 mg/dL   GFR calc non Af Amer >60 >60 mL/min   GFR calc Af Amer >60 >60 mL/min    Comment: (NOTE) The eGFR has been calculated using the CKD EPI equation. This calculation has not been validated in all clinical situations. eGFR's persistently <60 mL/min signify possible Chronic Kidney Disease.    Anion gap 13 5 - 15  Brain natriuretic peptide     Status: Abnormal   Collection Time: 12/21/14 10:55 AM  Result Value Ref Range   B Natriuretic Peptide 1741.5 (H) 0.0 - 100.0 pg/mL  CBC     Status: Abnormal   Collection Time: 12/21/14 10:55 AM  Result Value Ref Range   WBC 12.2 (H) 4.0 - 10.5 K/uL   RBC 5.78 4.22 - 5.81 MIL/uL   Hemoglobin 11.0 (L) 13.0 - 17.0 g/dL   HCT 40.3  39.0 - 52.0 %   MCV 69.7 (L) 78.0 - 100.0 fL   MCH 19.0 (L) 26.0 - 34.0 pg   MCHC 27.3 (L) 30.0 - 36.0 g/dL   RDW 22.6 (H) 11.5 - 15.5 %   Platelets 292 150 - 400 K/uL  Glucose, capillary     Status: Abnormal   Collection Time: 12/21/14 12:11 PM  Result Value Ref Range   Glucose-Capillary 161 (H) 65 - 99 mg/dL  Glucose, capillary     Status: Abnormal   Collection Time: 12/21/14  4:47 PM  Result Value Ref Range   Glucose-Capillary 162 (H) 65 - 99 mg/dL  Glucose, capillary     Status: Abnormal   Collection Time: 12/21/14  9:51 PM  Result Value Ref Range   Glucose-Capillary 161 (H) 65 - 99 mg/dL   Comment 1 Notify RN    Comment 2 Document in Chart   CBC     Status: Abnormal   Collection Time: 12/22/14  6:23 AM  Result Value Ref Range   WBC 10.1 4.0 - 10.5 K/uL   RBC 5.60 4.22 - 5.81 MIL/uL   Hemoglobin 10.5 (L) 13.0 - 17.0 g/dL   HCT 38.9 (L) 39.0 - 52.0 %   MCV 69.5 (L) 78.0 - 100.0 fL   MCH 18.8 (L) 26.0 - 34.0 pg   MCHC 27.0 (L) 30.0 - 36.0 g/dL   RDW 22.9 (H) 11.5 - 15.5 %   Platelets 281 150 - 400 K/uL  Basic metabolic panel     Status: Abnormal   Collection Time: 12/22/14  6:23 AM  Result Value Ref Range   Sodium 141 135 - 145 mmol/L   Potassium 4.2 3.5 - 5.1 mmol/L    Comment: HEMOLYSIS AT THIS LEVEL MAY AFFECT RESULT   Chloride 97 (L)  101 - 111 mmol/L   CO2 33 (H) 22 - 32 mmol/L   Glucose, Bld 142 (H) 65 - 99 mg/dL   BUN 21 (H) 6 - 20 mg/dL   Creatinine, Ser 1.09 0.61 - 1.24 mg/dL   Calcium 8.3 (L) 8.9 - 10.3 mg/dL   GFR calc non Af Amer >60 >60 mL/min   GFR calc Af Amer >60 >60 mL/min    Comment: (NOTE) The eGFR has been calculated using the CKD EPI equation. This calculation has not been validated in all clinical situations. eGFR's persistently <60 mL/min signify possible Chronic Kidney Disease.    Anion gap 11 5 - 15  Brain natriuretic peptide     Status: Abnormal   Collection Time: 12/22/14  6:23 AM  Result Value Ref Range   B Natriuretic Peptide  1896.0 (H) 0.0 - 100.0 pg/mL  Glucose, capillary     Status: Abnormal   Collection Time: 12/22/14  7:25 AM  Result Value Ref Range   Glucose-Capillary 148 (H) 65 - 99 mg/dL    Current Facility-Administered Medications  Medication Dose Route Frequency Provider Last Rate Last Dose  . acetaminophen (TYLENOL) tablet 650 mg  650 mg Oral Q6H PRN Reyne Dumas, MD       Or  . acetaminophen (TYLENOL) suppository 650 mg  650 mg Rectal Q6H PRN Reyne Dumas, MD      . aztreonam (AZACTAM) 1 g in dextrose 5 % 50 mL IVPB  1 g Intravenous 3 times per day Velvet Bathe, MD   1 g at 12/22/14 0315  . collagenase (SANTYL) ointment   Topical Daily Reyne Dumas, MD      . diazepam (VALIUM) injection 5-10 mg  5-10 mg Intravenous Q1H PRN Eugenie Filler, MD   5 mg at 12/22/14 0301  . docusate sodium (COLACE) capsule 100 mg  100 mg Oral BID Reyne Dumas, MD   100 mg at 12/22/14 0946  . famotidine (PEPCID) tablet 20 mg  20 mg Oral BID Eugenie Filler, MD   20 mg at 12/22/14 0946  . feeding supplement (ENSURE ENLIVE) (ENSURE ENLIVE) liquid 237 mL  237 mL Oral BID BM Eugenie Filler, MD   237 mL at 12/21/14 1030  . folic acid (FOLVITE) tablet 1 mg  1 mg Oral Daily Velvet Bathe, MD   1 mg at 12/22/14 0946  . furosemide (LASIX) injection 60 mg  60 mg Intravenous BID Arnoldo Lenis, MD   60 mg at 12/22/14 0859  . heparin injection 5,000 Units  5,000 Units Subcutaneous 3 times per day Reyne Dumas, MD   5,000 Units at 12/22/14 9458  . insulin aspart (novoLOG) injection 0-9 Units  0-9 Units Subcutaneous TID WC Reyne Dumas, MD   1 Units at 12/22/14 0858  . levalbuterol (XOPENEX) nebulizer solution 0.63 mg  0.63 mg Nebulization Q6H PRN Reyne Dumas, MD      . LORazepam (ATIVAN) injection 1 mg  1 mg Intravenous Q6H PRN Ambrose Finland, MD   1 mg at 12/21/14 1012  . morphine 2 MG/ML injection 2 mg  2 mg Intravenous Q4H PRN Eugenie Filler, MD   2 mg at 12/22/14 0139  . multivitamin with minerals tablet 1  tablet  1 tablet Oral Daily Velvet Bathe, MD   1 tablet at 12/22/14 0946  . thiamine (VITAMIN B-1) tablet 100 mg  100 mg Oral Daily Velvet Bathe, MD   100 mg at 12/22/14 5929    Musculoskeletal: Strength & Muscle Tone:  decreased Gait & Station: unable to stand Patient leans: N/A  Psychiatric Specialty Exam: ROS  Blood pressure 122/85, pulse 116, temperature 97.3 F (36.3 C), temperature source Axillary, resp. rate 11, height 5' 7"  (1.702 m), weight 122.2 kg (269 lb 6.4 oz), SpO2 93 %.Body mass index is 42.18 kg/(m^2).  General Appearance: Guarded  Eye Contact::  Minimal  Speech:  Blocked, Slow and Slurred  Volume:  Decreased  Mood:  Angry and Irritable  Affect:  Constricted and Depressed  Thought Process:  Irrelevant and Tangential  Orientation:  Full (Time, Place, and Person)  Thought Content:  Rumination  Suicidal Thoughts:  No  Homicidal Thoughts:  No  Memory:  Immediate;   Fair Recent;   Fair  Judgement:  Impaired  Insight:  Shallow  Psychomotor Activity:  Restlessness  Concentration:  Fair  Recall:  [place]: Fair  Akathisia:  Negative  Handed:  Right  AIMS (if indicated):     Assets:  Communication Skills Desire for Improvement Housing Leisure Time Resilience Social Support Talents/Skills Transportation  ADL's:  Impaired  Cognition: Impaired,  Mild  Sleep:      Treatment Plan Summary: Patient is restless, irritable, angry, shouting and screaming during this evaluation and he required Haldol and soft restraints. Patient has a history of substance abuse.   Daily contact with patient to assess and evaluate symptoms and progress in treatment and Medication management  Disposition: Patient does not meet criteria for capacity to make his own medical decisions as is not able to understand his current medical condition and required treatment. Patient has no insight or judgment regarding his treatment Continue safety sitter as patient  has been hostile, irritable and agitated to several staff members  Please contact psychiatric social service regarding healthcare power of attorney needs Haldol 3 mg IV every 6 hours as needed for agitation and aggressive behavior Ativan 1 mg every 6 hours as needed for restlessness and agitation Continue with Valium 5 mg IV as needed for uncontrollable anxiety/panic attacks  Patient does not meet criteria for psychiatric inpatient admission. Supportive therapy provided about ongoing stressors.   Appreciate psychiatric consultation will sign off at this time Please contact [phone] or [phone] if needs further assistance   Geordie Nooney,JANARDHAHA R. 12/22/2014 11:26 AM

## 2014-12-22 NOTE — Evaluation (Signed)
Occupational Therapy Evaluation Patient Details Name: Tom Reynolds MRN: [id number] DOB: 10-17-1965 Today's Date: 12/22/2014    History of Present Illness 49 y.o. with a history of substance abuse, DM, HTN, alcoholic cardiomyopathy/chronic systolic heart failure with an EF of 20-25% and chronic kidney disease stage II, admitte4d with worsening shortness of breath over the last 2 weeks and worsening bilateral lower extremity edema associated with subjective fever, productive cough, genital pain and swelling of the scrotum as well as encephalopathy   Clinical Impression   This 49 yo male admitted with above presents to acute OT with decreased cognition (not sure of baseline), decreased safety awareness, decreased balance/mobility all affecting his ability to care for himself or help care for himself. He will benefit from acute OT with follow up OT at SNF to work towards a S level or better.    Follow Up Recommendations  SNF    Equipment Recommendations   (TBD at next venue)       Precautions / Restrictions Precautions Precautions: Fall Precaution Comments: scrotal edema Restrictions Weight Bearing Restrictions: No      Mobility Bed Mobility Overal bed mobility: Needs Assistance Bed Mobility: Supine to Sit     Supine to sit: Mod assist     General bed mobility comments: with verbal cues for sequencing and hand placement with HOB up  Transfers Overall transfer level: Needs assistance Equipment used: Rolling walker (2 wheeled) Transfers: Sit to/from [employer] Sit to Stand: Mod assist;+2 physical assistance Stand pivot transfers: +2 physical assistance;Mod assist (with tendency to sit without warning)            Balance Overall balance assessment: Needs assistance Sitting-balance support: Bilateral upper extremity supported;Feet supported Sitting balance-Leahy Scale: Poor Sitting balance - Comments: tends to want to lean anteriorly   Standing  balance support: Bilateral upper extremity supported Standing balance-Leahy Scale: Poor                              ADL Overall ADL's : Needs assistance/impaired   Eating/Feeding Details (indicate cue type and reason): Varies depending on his mood/demeaner   Grooming Details (indicate cue type and reason): Varies depending on his mood/demeaner   Upper Body Bathing Details (indicate cue type and reason): Varies depending on his mood/demeaner   Lower Body Bathing Details (indicate cue type and reason): Varies depending on his mood/demeaner; with +2 Mod sit<>stand Upper Body Dressing : Cueing for UE precautions Upper Body Dressing Details (indicate cue type and reason): Varies depending on his mood/demeaner   Lower Body Dressing Details (indicate cue type and reason): Varies depending on his mood/demeaner; with +2 Mod sit<>stand Toilet Transfer: Moderate assistance;+2 for physical assistance;Stand-pivot;RW Toilet Transfer Details (indicate cue type and reason): sits prematurely Toileting- Clothing Manipulation and Hygiene: Total assistance (with Mod A +2 sit<>stand)               Vision Additional Comments: unknown due to decreased ablity to answer questions          Pertinent Vitals/Pain Pain Assessment: Faces Pain Score: 4  Faces Pain Scale: Hurts little more Pain Location: scrotum Pain Descriptors / Indicators: Pressure;Sore Pain Intervention(s): Repositioned     Hand Dominance Right   Extremity/Trunk Assessment Upper Extremity Assessment Upper Extremity Assessment: Defer to OT evaluation   Lower Extremity Assessment Lower Extremity Assessment: Generalized weakness       Communication Communication Communication: No difficulties   Cognition Arousal/Alertness: Awake/alert Behavior During  Therapy: Agitated;Impulsive;Restless Overall Cognitive Status: No family/caregiver present to determine baseline cognitive functioning Area of Impairment:  Orientation Orientation Level: Disoriented to;Place;Time;Situation                            Home Living Family/patient expects to be discharged to:: Skilled nursing facility                                        Prior Functioning/Environment          Comments: Unknown, and pt inconsistent with answers as well as getting agitated the more questions we asked of him    OT Diagnosis: Generalized weakness;Cognitive deficits   OT Problem List: Decreased strength;Decreased activity tolerance;Impaired balance (sitting and/or standing);Decreased cognition;Decreased safety awareness;Obesity;Decreased knowledge of use of DME or AE   OT Treatment/Interventions: Self-care/ADL training;Patient/family education;Balance training;Therapeutic activities;DME and/or AE instruction    OT Goals(Current goals can be found in the care plan section) Acute Rehab OT Goals Patient Stated Goal: to get up out of bed OT Goal Formulation: Patient unable to participate in goal setting Time For Goal Achievement: 01/05/15 Potential to Achieve Goals: Good  OT Frequency: Min 2X/week   Barriers to D/C: Decreased caregiver support          Co-evaluation PT/OT/SLP Co-Evaluation/Treatment: Yes Reason for Co-Treatment: For patient/therapist safety   OT goals addressed during session: Strengthening/ROM      End of Session Equipment Utilized During Treatment: Gait belt;Rolling walker Nurse Communication: Mobility status  Activity Tolerance: Patient limited by fatigue Patient left: in bed;with call bell/phone within reach;with bed alarm set Psychiatrist)   Time: 0865-7846 OT Time Calculation (min): 19 min Charges:  OT General Charges $OT Visit: 1 Procedure OT Evaluation $Initial OT Evaluation Tier I: 1 Procedure  Evette Georges [phone] 12/22/2014, 12:36 PM

## 2014-12-22 NOTE — Evaluation (Signed)
Physical Therapy Evaluation Patient Details Name: Tom Reynolds MRN: [id number] DOB: [birth year] Today's Date: 12/22/2014   History of Present Illness  49 y.o. with a history of substance abuse, DM, HTN, alcoholic cardiomyopathy/chronic systolic heart failure with an EF of 20-25% and chronic kidney disease stage II, admitte4d with worsening shortness of breath over the last 2 weeks and worsening bilateral lower extremity edema associated with subjective fever, productive cough, genital pain and swelling of the scrotum as well as encephalopathy  Clinical Impression  Pt awake and conversant on arrival, unaware of place, date or situation and stating he wants to go home. Even after session with pt unable to walk or mobilize without 2 person assist he demonstrated no insight into deficits. Pt with impaired balance, strength, cognition, transfers and gait who will benefit from acute therapy to maximize strength, function and independence to decrease burden of care.     Follow Up Recommendations SNF;Supervision/Assistance - 24 hour    Equipment Recommendations  None recommended by PT    Recommendations for Other Services       Precautions / Restrictions Precautions Precautions: Fall Precaution Comments: scrotal edema Restrictions Weight Bearing Restrictions: No      Mobility  Bed Mobility Overal bed mobility: Needs Assistance Bed Mobility: Supine to Sit;Sit to Supine     Supine to sit: Mod assist Sit to supine: Mod assist;+2 for physical assistance   General bed mobility comments: with verbal cues for sequencing and hand placement with HOB up, use of rail and HHA to fully elevate trunk . Pt able to pull himself toward HOb with rails, cues and trendelenburg for return to bed  Transfers Overall transfer level: Needs assistance Equipment used: Rolling walker (2 wheeled) Transfers: Sit to/from Stand Sit to Stand: Mod assist;+2 physical assistance Stand pivot transfers: +2 physical  assistance;Mod assist       General transfer comment: cues for hand placement, sequence and safety. Pt with 2 trials to attempt to stand from bed and recliner with pt maintaining anterior flexion in standing with assist to bring RW closer as he pushes it too far away and not following commands to correct.   Ambulation/Gait Ambulation/Gait assistance:  (pt unsafe to attempt at this time)              Careers information officer    Modified Rankin (Stroke Patients Only)       Balance Overall balance assessment: Needs assistance Sitting-balance support: Bilateral upper extremity supported;Feet supported Sitting balance-Leahy Scale: Fair Sitting balance - Comments: tends to want to lean anteriorly   Standing balance support: Bilateral upper extremity supported Standing balance-Leahy Scale: Poor                               Pertinent Vitals/Pain Pain Assessment: Faces Pain Score: 4  Faces Pain Scale: Hurts little more Pain Location: scrotum Pain Descriptors / Indicators: Pressure;Sore Pain Intervention(s): Repositioned  HR 120-130 with activity    Home Living Family/patient expects to be discharged to:: Skilled nursing facility                      Prior Function           Comments: Unknown, and pt inconsistent with answers as well as getting agitated the more questions we asked of him     Hand Dominance   Dominant Hand: Right  Extremity/Trunk Assessment   Upper Extremity Assessment: Defer to OT evaluation           Lower Extremity Assessment: Generalized weakness         Communication   Communication: No difficulties  Cognition Arousal/Alertness: Awake/alert Behavior During Therapy: Agitated;Impulsive;Restless Overall Cognitive Status: No family/caregiver present to determine baseline cognitive functioning Area of Impairment: Orientation Orientation Level: Disoriented to;Place;Time;Situation                   General Comments      Exercises        Assessment/Plan    PT Assessment Patient needs continued PT services  PT Diagnosis Difficulty walking;Generalized weakness;Altered mental status   PT Problem List Decreased strength;Decreased cognition;Decreased activity tolerance;Decreased balance;Decreased knowledge of use of DME;Decreased safety awareness;Decreased skin integrity;Obesity;Decreased mobility;Pain;Decreased coordination  PT Treatment Interventions Gait training;DME instruction;Functional mobility training;Therapeutic activities;Therapeutic exercise;Balance training;Patient/family education;Cognitive remediation   PT Goals (Current goals can be found in the Care Plan section) Acute Rehab PT Goals Patient Stated Goal: to go home PT Goal Formulation: With patient Time For Goal Achievement: 01/05/15 Potential to Achieve Goals: Fair    Frequency Min 3X/week   Barriers to discharge Decreased caregiver support      Co-evaluation PT/OT/SLP Co-Evaluation/Treatment: Yes Reason for Co-Treatment: For patient/therapist safety;Necessary to address cognition/behavior during functional activity   OT goals addressed during session: Strengthening/ROM       End of Session Equipment Utilized During Treatment: Gait belt Activity Tolerance: Patient tolerated treatment well Patient left: in bed;with call bell/phone within reach;with nursing/sitter in room;with bed alarm set;with restraints reapplied (bil wrists) Nurse Communication: Mobility status;Precautions         Time: 1130-1147 PT Time Calculation (min) (ACUTE ONLY): 17 min   Charges:   PT Evaluation $Initial PT Evaluation Tier I: 1 Procedure     PT G CodesDelorse Lek 12/22/2014, 12:40 PM Delaney Meigs, PT [phone]

## 2014-12-22 NOTE — Consult Note (Signed)
Consultation Note Date: 12/22/2014   Patient Name: Tom Reynolds  DOB: [birth year]  MRN: [id number]  Age / Sex: 49 y.o., male   PCP: No primary care provider on file. Referring Physician: Rodolph Bong, MD  Reason for Consultation: GOC  Palliative Care Assessment and Plan Summary of Established Goals of Care and Medical Treatment Preferences    Palliative Care Discussion Held Today:   Mr. Haynesworth is sedated after receiving ativan per nursing. He was reportedly very agitated and cursing, trying to pull IV out and leave hospital. Psych note reviewed as he does not have capacity. I was unable to engage with Mr. Ureta at this time.   I did speak with his sister, Clydie Braun, who gives me insight into his history and how he has had many tough years. He lived with his mother for ~10 yrs prior to her passing ~2 yrs ago after his wife cheated on him (now ex-wife). Clydie Braun believes that he was having substance abuse prior to separating from his wife and that stressors from life has made this worsen over the years to becoming at his worse after his mother died. Clydie Braun is tearful explaining all that he has been through and how he has told her "if I'm going to die, I'm going to be where I want to be, doing what I want to do." She says that he has spoken like this a few times which hurts her feelings because he has family that loves him and cares for him and what happens to him.   We had a frank conversation that his heart failure is severe and that I do not believe this will get better for him especially given his history of noncompliance and how difficult this would be for someone with perfect compliance. She was very tearful but says that she appreciated my honesty. She spoke of fear for his spiritual soul and fear that if he died he would go to hell and this is frequently on her mind. We also discussed code status and she says she would want him to live as long as possible to give him chance to get his soul in the  right place. However, she also says that he has been suffering through this life and she would hate to put him through more (she has had previous conversations and acknowledges resuscitation is not likely to benefit him). She will discuss with her other siblings and hopefully we can discuss with Mr. Ribaudo as well. She does allude to an experience with her mother who was given a few weeks to live but hospice came in and she lived one day (very against hospice now).   Very difficult situation and Clydie Braun says that the gentleman that was allowing Mr. Robarge to live with him in exchange for help around the house says he cannot return. He has been more aggressive since June and difficult to be around she says. Clydie Braun is looking into social services and living arrangements for her brother. I will continue to follow.    Contacts/Participants in Discussion: Primary Decision Maker: Patient does not have capacity per psych. Siblings are next of kin.   Goals of Care/Code Status/Advance Care Planning:   Code Status: FULL - family considering options   Symptom Management:   Agitation: Continue ativan 1 mg every 6 hours prn and CIWA.   Pain: Continue morphine prn.   Psycho-social/Spiritual:   Support System: Family supportive although not present in town.   Desire for further Chaplaincy support: yes when  more alert and able to converse  Prognosis: Very poor - weeks to months likely.   Discharge Planning:  To be determined. SNF likely needed but he will likely not agree to this.        Chief Complaint/HPI: 49 yo with PMH polysubstance abuse (alcohol, cocaine), bipolar, systolic heart failure (EF 15-20%). Admitted with chest pain r/t systolic heart failure and overload with anasarca, sepsis r/t BLE cellulitis/scrotal cellulitis, ongoing encephalopathy likely s/t withdrawal and sepsis.   Primary Diagnoses  Present on Admission:  . Sepsis (HCC) . Acute renal failure superimposed on stage 3 chronic  kidney disease (HCC) . Acute on chronic systolic heart failure (HCC) . Hyponatremia . OSA (obstructive sleep apnea) . Cellulitis of leg, right . Anasarca . Cellulitis . Scrotal swelling . Bipolar disorder (HCC) . Euthyroid sick syndrome  Palliative Review of Systems:   Unable to assess - sedated.    I have reviewed the medical record, interviewed the patient and family, and examined the patient. The following aspects are pertinent.  Past Medical History  Diagnosis Date  . Diabetes mellitus without complication (HCC)   . Hypertension   . CHF (congestive heart failure) (HCC)   . Bipolar disorder (HCC) 12/19/2014   Social History   Social History  . Marital Status: Divorced    Spouse Name: N/A  . Number of Children: N/A  . Years of Education: N/A   Social History Main Topics  . Smoking status: Former Games developer  . Smokeless tobacco: None  . Alcohol Use: No  . Drug Use: None  . Sexual Activity: Not Asked   Other Topics Concern  . None   Social History Narrative   History reviewed. No pertinent family history. Scheduled Meds: . aztreonam  1 g Intravenous 3 times per day  . collagenase   Topical Daily  . docusate sodium  100 mg Oral BID  . famotidine  20 mg Oral BID  . feeding supplement (ENSURE ENLIVE)  237 mL Oral BID BM  . folic acid  1 mg Oral Daily  . furosemide  60 mg Intravenous BID  . heparin  5,000 Units Subcutaneous 3 times per day  . insulin aspart  0-9 Units Subcutaneous TID WC  . multivitamin with minerals  1 tablet Oral Daily  . thiamine  100 mg Oral Daily   Continuous Infusions:  PRN Meds:.acetaminophen **OR** acetaminophen, diazepam, levalbuterol, LORazepam, morphine injection Medications Prior to Admission:  Prior to Admission medications   Medication Sig Start Date End Date Taking? Authorizing Provider  aspirin 81 MG tablet Take 81 mg by mouth daily.   Yes Historical Provider, MD  omeprazole (PRILOSEC) 40 MG capsule Take 40 mg by mouth daily.    Yes Historical Provider, MD   Allergies  Allergen Reactions  . Clindamycin/Lincomycin Rash  . Penicillins Other (See Comments)    intolerance   CBC:    Component Value Date/Time   WBC 10.1 12/22/2014 0623   HGB 10.5* 12/22/2014 0623   HCT 38.9* 12/22/2014 0623   PLT 281 12/22/2014 0623   MCV 69.5* 12/22/2014 0623   NEUTROABS 11.1* 12/11/2014 1845   LYMPHSABS 0.9 12/11/2014 1845   MONOABS 0.5 12/11/2014 1845   EOSABS 0.0 12/11/2014 1845   BASOSABS 0.0 12/11/2014 1845   Comprehensive Metabolic Panel:    Component Value Date/Time   NA 141 12/22/2014 0623   K 4.2 12/22/2014 0623   CL 97* 12/22/2014 0623   CO2 33* 12/22/2014 0623   BUN 21* 12/22/2014 2130  CREATININE 1.09 12/22/2014 0623   GLUCOSE 142* 12/22/2014 0623   CALCIUM 8.3* 12/22/2014 0623   AST 39 12/19/2014 0328   ALT 13* 12/19/2014 0328   ALKPHOS 200* 12/19/2014 0328   BILITOT 2.3* 12/19/2014 0328   PROT 6.2* 12/19/2014 0328   ALBUMIN 1.8* 12/19/2014 0328    Physical Exam:  Vital Signs: BP 122/85 mmHg  Pulse 116  Temp(Src) 97.3 F (36.3 C) (Axillary)  Resp 11  Ht 5\' 7"  (1.702 m)  Wt 122.2 kg (269 lb 6.4 oz)  BMI 42.18 kg/m2  SpO2 93% SpO2: SpO2: 93 % O2 Device: O2 Device: Nasal Cannula O2 Flow Rate: O2 Flow Rate (L/min): 2 L/min Intake/output summary:  Intake/Output Summary (Last 24 hours) at 12/22/14 1541 Last data filed at 12/22/14 1227  Gross per 24 hour  Intake   1480 ml  Output   5100 ml  Net  -3620 ml   LBM: Last BM Date: 12/21/14 Baseline Weight: Weight: 125.278 kg (276 lb 3 oz) Most recent weight: Weight: 122.2 kg (269 lb 6.4 oz)  Exam Findings:   General: NAD, sedated, restrained HEENT: [place]/AT CVS: Tachycardic Resp: No labored breathing Abd: Soft, NT, ND, obese Extrem: BLE erythema, edema 3+ Neuro: Sedated           Palliative Performance Scale: 20 %                Additional Data Reviewed: Recent Labs     12/21/14  1055  12/22/14  0623  WBC  12.2*  10.1  HGB   11.0*  10.5*  PLT  292  281  NA  137  141  BUN  23*  21*  CREATININE  1.10  1.09     Time In: 1450 Time Out: 1600 Time Total:  Greater than 50%  of this time was spent counseling and coordinating care related to the above assessment and plan.   Signed by:  Yong Channel, NP Palliative Medicine Team Pager # [phone] (M-F 8a-5p) Team Phone # [phone] (Nights/Weekends)

## 2014-12-22 NOTE — Progress Notes (Addendum)
Patient Name: Tom Reynolds Date of Encounter: 12/22/2014    SUBJECTIVE: Strong history of medical noncompliance. Background history of chronic systolic heart failure, alcohol abuse, polysubstance abuse, bipolar disorder, chronic kidney disease, and hypertension. Admitted with sepsis secondary to bilateral lower extremity cellulitis.  Since admission fluid status is near 0. He did have a good diuresis overnight of 3 L.  Moaning that he wants to go to hospice.  Would not allow me to examine him.  TELEMETRY:  Normal sinus rhythm on telemetry. No A. fib or other arrhythmia. Filed Vitals:   12/21/14 1934 12/21/14 2341 12/22/14 0400 12/22/14 0500  BP: 119/89 113/78 122/85   Pulse: 120 119 116   Temp: 97.7 F (36.5 C) 97.6 F (36.4 C) 97.3 F (36.3 C)   TempSrc: Axillary Axillary Axillary   Resp: Height:      Weight:    269 lb 6.4 oz (122.2 kg)  SpO2: 99% 91% 93%     Intake/Output Summary (Last 24 hours) at 12/22/14 1041 Last data filed at 12/22/14 0916  Gross per 24 hour  Intake   1480 ml  Output   4775 ml  Net  -3295 ml   LABS: Basic Metabolic Panel:  Recent Labs  09/81/19 0308 12/21/14 1055 12/22/14 0623  NA 139 137 141  K 4.8 4.2 4.2  CL 103 101 97*  CO2 25 23 33*  GLUCOSE 141* 141* 142*  BUN 24* 23* 21*  CREATININE 1.07 1.10 1.09  CALCIUM 8.4* 8.3* 8.3*  MG 2.0  --   --    CBC:  Recent Labs  12/21/14 1055 12/22/14 0623  WBC 12.2* 10.1  HGB 11.0* 10.5*  HCT 40.3 38.9*  MCV 69.7* 69.5*  PLT 292 281   Cardiac Enzymes:  Recent Labs  12/20/14 0903 12/20/14 1650 12/20/14 1859  TROPONINI 0.03 0.03 0.04*   BNP    Component Value Date/Time   BNP 1896.0* 12/22/2014 0623    Echocardiogram: Dilated left ventricle with LVEF 15-20%. Moderate to severe mitral regurgitation. Dilated right ventricle. Severely reduced RV function. PA systolic pressure 41 mmHg.  Radiology/Studies:  CXR reveals CHF/pulmonary edema  Physical  Exam: Blood pressure 122/85, pulse 116, temperature 97.3 F (36.3 C), temperature source Axillary, resp. rate 11, height  (1.702 m), weight 269 lb 6.4 oz (122.2 kg), SpO2 93 %. Weight change: -5 lb 11.7 oz (-2.6 kg)  Wt Readings from Last 3 Encounters:  12/22/14 269 lb 6.4 oz (122.2 kg)   Will not cooperate for examination Unable to assess neck veins. Decreased breath sounds at both bases with rales in the midlung fields When I try to listen to his heart, he starts singing and talking.    ASSESSMENT:  1. Acute on chronic systolic heart failure with pulmonary edema and right heart failure. Currently still volume overloaded. 2. History of bipolar disorder and I question the patient's competence to make decisions for himself. 3. Cellulitis/scrotal infection with sepsis, resolving  Plan:  Continue diuresis to remove volume overload.  Long-acting nitrates and hydralazine as tolerated by blood pressure until diuresis is completed. Perhaps at that time angiotensin receptor blockade can be started.  Psychiatric consultation to determine competence.  If competent referral to hospice may not be a bad idea. He is already actively trying to die by not complying with medical therapy. I have no idea what his overall prognosis would be, but from the cardiac standpoint, long-term prognosis is very poor.  Addendum: 4:42 PM  Upon returning to re-evaluate, he was more cooperative. Had has Cheyne-Stokes respirations. Heart auscultation is c/w pericardial rub vs systolic murmur with respiratory accentuation     SignedLesleigh Noe 12/22/2014, 10:41 AM

## 2014-12-22 NOTE — Progress Notes (Signed)
CSW received consult for substance abuse- per notes pt is still disoriented and agitated.  CSW will continue to follow for potential housing needs and substance abuse consult when pt is able to participate in evaluation.  Merlyn Lot, LCSWA Clinical Social Worker [phone]

## 2014-12-22 NOTE — Progress Notes (Addendum)
[facility] PROGRESS NOTE  Henson Fraticelli ZOX:[id number] DOB: 01-Oct-1965 DOA: 12/11/2014 PCP: No primary care provider on file.  Assessment/Plan:  #1 history of cardiomyopathy/acute on chronic systolic heart failure with EF of 15-20%per 2 d echo 12/19/2014 Patient not on any heart failure medications in the outpatient setting. Patient noted to be in acute on chronic heart failure with anasarca and now complains of shortness of breath this morning. Patient sounds wet on physical exam. Chest x-ray consistent with pulmonary edema. Cardiac enzymes slightly elevated however likely secondary to volume overload. 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 Patient early on in the hospitalization was in acute renal failure and a such ACE inhibitor was discontinued. 2-D echo done on 12/19/2014 with a EF of 15-20% which is worse than prior EF of 30%. Patient with moderate to severe mitral valvular regurgitation, severely dilated left atrium, moderately dilated right ventricle and severely reduced right ventricular systolic function. Moderately dilated right atrium. Moderate pulmonary valvular regurgitation. Patient was given Lasix 20 mg IV 1, 3 days ago with 1.025 L urine output. Patient's renal function has improved and as such was placed on Lasix 40 mg IV every 12 hours. Patient was -3.018 L over the past 24 hours after increasing Lasix to 60 IV every 12 hours per cardiology. Strict I's and O's. Daily weights.CXR still with significant pulmonary edema. Will hold off on beta blocker at this time due to acute decompensation. Once acute decompensation has improved may consider placing on labetalol. Continue to hold off on ACE inhibitor due to recent acute renal failure during this hospitalization. Continue current dose of Lasix at 60 mg IV every 12 hours with a goal of net -2-3 L per day per cardiology. Patient was placed on the BiPAP 2 days ago and currently off BiPAP  and resting comfortably. Cardiology following and appreciate input and recommendations.   #2 anasarca Questionable etiology. Likely secondary to cardiomyopathy/acute on chronic CHF exacerbation. Improving with diuresis, however still significantly volume overloaded. Patient does have a low albumen. Patient with normal LFTs. Patient however with prior history of cardiomyopathy and currently in acute on chronic systolic heart failure. Patient with complaints of shortness of breath. 2-D echo was done on 12/19/2014 with a depressed ejection fraction of 15-20% worse than prior EF of 30%. Patient also noted to have moderate to severe mitral valvular regurgitation, severely dilated left atrium moderately dilated right ventricle, severely reduced right ventricular systolic function, moderately dilated right atrium and moderate pulmonary valvular regurgitation.Blood pressure currently stable. Patient is -3.018 L over the past 24 hours after being started on IV Lasix. Lasix dose has been increased per cardiology.   #3 sepsis Patient was admitted with sepsis felt to be secondary to bilateral lower extremity cellulitis, scrotal cellulitis. On admission patient had elevated lactic acid level of 3.83 and was in acute on chronic renal failure with a creatinine going up as high as 2.32 on admission from a baseline of 1.26. Patient was pancultured. Urine cultures were negative. Blood cultures with 1/2 coag neg. staph likely a contaminant. Patient has been seen in consultation by urology recommend antibiotic and wound care treatment with outpatient follow-up. Patient was transitioned to IV clindamycin, however developed a rash and subsequently has been transitioned back to IV Azactam. Continue Azactam for now. Saline lock IV fluids. Follow.  #4 scrotal swelling and cellulitis/ LE cellulitis Patient currently afebrile. WBC has trended down. Patient still with significant scrotal swelling. Patient has been seen in  consultation  by urology will feel the etiology of patient's swelling and cellulitis is secondary to poor hygiene. CT pelvis was negative for soft tissue gas or abscess. Patient status post Foley catheter placement with good output. KVO IV fluids. Patient was initially on IV vancomycin and IV Azactam and subsequently transitioned to IV clindamycin however developed a rash to his back, and now on IV Azactam. Scrotal elevation. Continue wound care dressings per wound care nurse and recommendations. Will likely need follow-up with urology as outpatient.  #5 acute encephalopathy Likely multifactorial secondary to alcohol withdrawal and current infection. Patient less agitated after changing Ativan to Valium for the withdrawal protocol. Dilaudid has been discontinued. QTC is normal. At family's insistence yesterday Haldol has been discontinued. Patient was aggressive and had to be given some Haldol 3 days ago. Patient does state he has a prior history of bipolar disorder which was verified on care everywhere however currently on no medications. Patient states supposed to be on Zoloft 200 mg daily. Patient has been seen in consultation by psychiatry and Haldol dose was decreased to 3 g IV every 6 hours as needed, Ativan 1 mg every 6 hours as needed for restlessness and agitation and to continue Valium 5 mg IV as needed for uncontrollable anxiety/panic attacks per psych recommendations. At family's insistence Haldol has been discontinued. Psychiatry following and appreciate input and recommendations.  #6 acute renal failure chronic kidney disease stage II Baseline renal function is 1.26. On admission patient was up to 2.32. Renal function improved and creatinine currently at 1.09. Likely secondary to acute on chronic CHF exacerbation. Patient however also noted to be volume loaded on examination. Patient with urine output of 4.375 L over the past 24 hours. Continue to hold nephrotoxic agents. Continue IV Lasix.   #7  hyponatremia Resolved.  #8 ETOH abuse Patient likely going through withdrawal with agitation. Per nursing improvement with agitation with change from ativan to valium on withdrawal protocol. Thiamine. Folic acid. Follow.  #9 bipolar disorder Patient with a history of bipolar disorder per patient and verified on care everywhere from her PCPs note. Patient states was on Zoloft 200 mg daily. Patient with improvement in agitation and aggressive behavior. Patient has been seen in consultation by psychiatry who recommended continuing safety sitter. Haldol has been discontinued  at family's insistence. Ativan 1 mg every 6 hours as needed for restlessness and agitation. Was recommended to continue Valium 5 mg IV as needed for uncontrollable anxiety/panic attacks. Appreciate psychiatric input.   #10 gastroesophageal reflux disease Pepcid.  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 Will need repeat thyroid function studies in about 4-6 weeks post discharge.  #12 prophylaxis Pepcid for GI prophylaxis. Heparin for DVT prophylaxis.  #13 prognosis Patient being treated for cellulitis and scrotal swelling as well as acute on chronic systolic heart failure EF of 15-20% per 2-D echo. Patient with anasarca. Patient also with a history of polysubstance abuse. Patient with improvement with his cellulitis on empiric antibiotics. Patient on IV Lasix with good diuresis and some improvement with his anasarca however still significantly volume overloaded. Patient alert to self place and time. Patient asking to be discharged home, asking for medical treatment to be discontinued and wanted to go home with hospice. Call and spoke with sister who is the healthcare power of attorney, Lyla Son [phone], who is asking that patient be possibly IVC in order to be treated. Will have psychiatry reassess patient for capacity. Will also consulted palliative  care for goals of care.   Code  Status: Full Family Communication: No family at bedside. Updated sister via telephoneLyla Son [phone]) Disposition Plan: Remain the step down unit   Consultants:  Urology: Dr. Neva Seat 12/12/2014  Psychiatry: Dr. Elsie Saas 12/19/2014  Cardiology: Dr. Wyline Mood 12/20/2014  Procedures:  CT pelvis 12/12/2014  Chest x-ray 12/11/2014  2-D echo 12/19/2014  Antibiotics:  IV ciprofloxacin 12/11/2014>>> ninth 29 2016  IV clindamycin 12/11/2014>>> 12/11/2014  IV clindamycin 12/16/2014>>>> 12/17/2014  IV Azactam 12/12/2014>>> 12/15/2014  IV Azactam 12/17/2014  IV vancomycin 12/11/2014>>>> 12/15/2014  HPI/Subjective: Patient off BiPAP overnight. Patient alert oriented to self place and time. Patient states breathing has improved. Patient asking to be discharged home. Patient stating he doesn't want anything done and asking for hospice.  Objective: Filed Vitals:   12/22/14 0400  BP: 122/85  Pulse: 116  Temp: 97.3 F (36.3 C)  Resp: 11    Intake/Output Summary (Last 24 hours) at 12/22/14 0933 Last data filed at 12/22/14 0916  Gross per 24 hour  Intake   1717 ml  Output   4775 ml  Net  -3058 ml   Filed Weights   12/12/14 0532 12/21/14 0500 12/22/14 0500  Weight: 125.4 kg (276 lb 7.3 oz) 124.8 kg (275 lb 2.2 oz) 122.2 kg (269 lb 6.4 oz)    Exam:   General:  Off BIPAP  Cardiovascular: Tachycardia with 3/6 SEM  Respiratory: Decreased BS in bases. Scattered crackles. No wheezing.  Abdomen: Soft, slight distention, positive bowel sounds, no rebound, no guarding. Rash noted on the back in the lower torso improved. scrotal wound with eschar. Scrotal swelling  Musculoskeletal: No clubbing or cyanosis. 2+ bilateral lower extremity edema up to mid abdomen improving. Right lower extremity with decreased erythema, wounds ( 2.5 x 2.5 , and 1.5 x 1.5cm) with sloughed/eschar minimal amount of yellow drainage noted on the right lower extremity.  Data Reviewed: Basic  Metabolic Panel:  Recent Labs Lab 12/17/14 0821 12/18/14 0425 12/19/14 0328 12/20/14 0308 12/21/14 1055 12/22/14 0623  NA 139 136 138 139 137 141  K 4.7 4.5 4.9 4.8 4.2 4.2  CL 102 98* 99* 103 101 97*  CO2 33*  GLUCOSE 106* 148* 141* 141* 141* 142*  BUN 31* 27* 26* 24* 23* 21*  CREATININE 1.49* 1.37* 1.18 1.07 1.10 1.09  CALCIUM 8.7* 8.5* 8.8* 8.4* 8.3* 8.3*  MG 2.1 2.1 2.1 2.0  --   --    Liver Function Tests:  Recent Labs Lab 12/19/14 0328  AST 39  ALT 13*  ALKPHOS 200*  BILITOT 2.3*  PROT 6.2*  ALBUMIN 1.8*   No results for input(s): LIPASE, AMYLASE in the last 168 hours.  Recent Labs Lab 12/16/14 1644  AMMONIA 37*   CBC:  Recent Labs Lab 12/18/14 0425 12/19/14 0328 12/20/14 0308 12/21/14 1055 12/22/14 0623  WBC 11.1* 11.1* 9.3 12.2* 10.1  HGB 10.8* 11.4* 10.4* 11.0* 10.5*  HCT 38.2* 40.2 37.2* 40.3 38.9*  MCV 67.4* 67.3* 67.8* 69.7* 69.5*  PLT 291 332 257 292 281   Cardiac Enzymes:  Recent Labs Lab 12/20/14 0903 12/20/14 1650 12/20/14 1859  TROPONINI 0.03 0.03 0.04*   BNP (last 3 results)  Recent Labs  12/20/14 0903 12/21/14 1055 12/22/14 0623  BNP 2510.0* 1741.5* 1896.0*    ProBNP (last 3 results) No results for input(s): PROBNP in the last 8760 hours.  CBG:  Recent Labs Lab 12/21/14 0737 12/21/14 1211 12/21/14 1647 12/21/14 2151 12/22/14  0725  GLUCAP 115* 161* 162* 161* 148*    Recent Results (from the past 240 hour(s))  Urine culture     Status: None   Collection Time: 12/12/14 12:45 PM  Result Value Ref Range Status   Specimen Description URINE, CLEAN CATCH  Final   Special Requests NONE  Final   Culture NO GROWTH 1 DAY  Final   Report Status 12/13/2014 FINAL  Final     Studies: Dg Chest Port 1 View  12/22/2014   CLINICAL DATA:  Shortness of Breath  EXAM: PORTABLE CHEST 1 VIEW  COMPARISON:  December 20, 2014  FINDINGS: There is interstitial edema with right pleural effusion and cardiomegaly.  There is pulmonary venous hypertension. There is atelectatic change in the bases. No adenopathy. No bone lesions  IMPRESSION: Congestive heart failure. No appreciable change from recent prior study.   Electronically Signed   By: Bretta Bang III M.D.   On: 12/22/2014 07:00   Dg Chest Port 1 View  12/20/2014   CLINICAL DATA:  Shortness of breath.  Fluid overload.  EXAM: PORTABLE CHEST 1 VIEW  COMPARISON:  12/11/2014 chest radiograph.  FINDINGS: Stable cardiomediastinal silhouette with moderate cardiomegaly. Slightly low lung volumes. No pneumothorax. Small bilateral pleural effusions, increased bilaterally. There is moderate pulmonary edema. Bibasilar hazy opacities, favor atelectasis.  IMPRESSION: 1. Stable moderate cardiomegaly with moderate pulmonary edema, in keeping with moderate congestive heart failure. 2. Increased bilateral small pleural effusions with associated bibasilar lung opacities likely representing atelectasis.   Electronically Signed   By: Delbert Phenix M.D.   On: 12/20/2014 10:03    Scheduled Meds: . aztreonam  1 g Intravenous 3 times per day  . collagenase   Topical Daily  . docusate sodium  100 mg Oral BID  . famotidine  20 mg Oral BID  . feeding supplement (ENSURE ENLIVE)  237 mL Oral BID BM  . folic acid  1 mg Oral Daily  . furosemide  60 mg Intravenous BID  . heparin  5,000 Units Subcutaneous 3 times per day  . insulin aspart  0-9 Units Subcutaneous TID WC  . multivitamin with minerals  1 tablet Oral Daily  . thiamine  100 mg Oral Daily   Continuous Infusions:    Principal Problem:   Acute on chronic systolic heart failure (HCC) Active Problems:   Scrotal swelling   Acute encephalopathy   Sepsis (HCC)   Acute renal failure superimposed on stage 3 chronic kidney disease (HCC)   Hyponatremia   OSA (obstructive sleep apnea)   Cellulitis of leg, right   Anasarca   Cellulitis   Blood poisoning (HCC)   Rash and nonspecific skin eruption   Bipolar disorder  (HCC)   Euthyroid sick syndrome    Time spent: 40 mins    [facility] MD [facility] Pager [phone]. If 7PM-7AM, please contact night-coverage at [email or web address], password [facility] 12/22/2014, 9:33 AM  LOS: 10 days

## 2014-12-23 DIAGNOSIS — F3113 Bipolar disorder, current episode manic without psychotic features, severe: Secondary | ICD-10-CM

## 2014-12-23 DIAGNOSIS — R601 Generalized edema: Secondary | ICD-10-CM

## 2014-12-23 DIAGNOSIS — G4733 Obstructive sleep apnea (adult) (pediatric): Secondary | ICD-10-CM

## 2014-12-23 DIAGNOSIS — L03115 Cellulitis of right lower limb: Secondary | ICD-10-CM

## 2014-12-23 LAB — CBC
HEMATOCRIT: 38.7 % — AB (ref 39.0–52.0)
HEMOGLOBIN: 10.5 g/dL — AB (ref 13.0–17.0)
MCH: 19 pg — AB (ref 26.0–34.0)
MCHC: 27.1 g/dL — AB (ref 30.0–36.0)
MCV: 69.9 fL — ABNORMAL LOW (ref 78.0–100.0)
Platelets: 236 10*3/uL (ref 150–400)
RBC: 5.54 MIL/uL (ref 4.22–5.81)
RDW: 22.6 % — AB (ref 11.5–15.5)
WBC: 8.8 10*3/uL (ref 4.0–10.5)

## 2014-12-23 LAB — BASIC METABOLIC PANEL
ANION GAP: 11 (ref 5–15)
Anion gap: 9 (ref 5–15)
BUN: 12 mg/dL (ref 6–20)
BUN: 16 mg/dL (ref 6–20)
CO2: 38 mmol/L — ABNORMAL HIGH (ref 22–32)
CO2: 35 mmol/L — ABNORMAL HIGH (ref 22–32)
Calcium: 8.1 mg/dL — ABNORMAL LOW (ref 8.9–10.3)
Calcium: 8 mg/dL — ABNORMAL LOW (ref 8.9–10.3)
Chloride: 95 mmol/L — ABNORMAL LOW (ref 101–111)
Chloride: 95 mmol/L — ABNORMAL LOW (ref 101–111)
Creatinine, Ser: 0.83 mg/dL (ref 0.61–1.24)
Creatinine, Ser: 0.94 mg/dL (ref 0.61–1.24)
GLUCOSE: 129 mg/dL — AB (ref 65–99)
Glucose, Bld: 125 mg/dL — ABNORMAL HIGH (ref 65–99)
POTASSIUM: 3.6 mmol/L (ref 3.5–5.1)
Potassium: 2.5 mmol/L — CL (ref 3.5–5.1)
Sodium: 142 mmol/L (ref 135–145)
Sodium: 141 mmol/L (ref 135–145)

## 2014-12-23 LAB — GLUCOSE, CAPILLARY
GLUCOSE-CAPILLARY: 126 mg/dL — AB (ref 65–99)
GLUCOSE-CAPILLARY: 145 mg/dL — AB (ref 65–99)
GLUCOSE-CAPILLARY: 118 mg/dL — AB (ref 65–99)
GLUCOSE-CAPILLARY: 125 mg/dL — AB (ref 65–99)

## 2014-12-23 LAB — MAGNESIUM: MAGNESIUM: 1.3 mg/dL — AB (ref 1.7–2.4)

## 2014-12-23 MED ORDER — POTASSIUM CHLORIDE CRYS ER 20 MEQ PO TBCR: 40.0000 meq | EXTENDED_RELEASE_TABLET | Freq: Three times a day (TID) | ORAL | Status: DC

## 2014-12-23 MED ORDER — POTASSIUM CHLORIDE 20 MEQ/15ML (10%) PO SOLN
40.0000 meq | Freq: Once | ORAL | Status: AC
  Administered 2014-12-23: 40 meq via ORAL
  Filled 2014-12-23: qty 30

## 2014-12-23 MED ORDER — POTASSIUM CHLORIDE CRYS ER 20 MEQ PO TBCR
40.0000 meq | EXTENDED_RELEASE_TABLET | ORAL | Status: AC
  Administered 2014-12-23 (×2): 40 meq via ORAL
  Filled 2014-12-23 (×2): qty 2

## 2014-12-23 MED ORDER — POTASSIUM CHLORIDE CRYS ER 20 MEQ PO TBCR
40.0000 meq | EXTENDED_RELEASE_TABLET | Freq: Once | ORAL | Status: AC
  Administered 2014-12-23: 40 meq via ORAL
  Filled 2014-12-23: qty 2

## 2014-12-23 MED ORDER — FUROSEMIDE 10 MG/ML IJ SOLN
40.0000 mg | Freq: Two times a day (BID) | INTRAMUSCULAR | Status: DC
  Administered 2014-12-23 – 2014-12-24 (×2): 40 mg via INTRAVENOUS
  Filled 2014-12-23 (×2): qty 4

## 2014-12-23 MED ORDER — MAGNESIUM SULFATE 4 GM/100ML IV SOLN
4.0000 g | Freq: Once | INTRAVENOUS | Status: AC
  Administered 2014-12-23: 4 g via INTRAVENOUS
  Filled 2014-12-23 (×2): qty 100

## 2014-12-23 MED ORDER — POTASSIUM CHLORIDE CRYS ER 20 MEQ PO TBCR
40.0000 meq | EXTENDED_RELEASE_TABLET | Freq: Two times a day (BID) | ORAL | Status: DC
  Administered 2014-12-24 – 2014-12-28 (×8): 40 meq via ORAL
  Filled 2014-12-23 (×10): qty 2

## 2014-12-23 NOTE — Progress Notes (Signed)
Patient Name: Tom Reynolds Date of Encounter: 12/23/2014    SUBJECTIVE: Strong history of medical noncompliance. Background history of chronic systolic heart failure, alcohol abuse, polysubstance abuse, bipolar disorder, chronic kidney disease, and hypertension. Admitted with sepsis secondary to bilateral lower extremity cellulitis.  Since admission fluid shows a marked diuresis over the past 24 hours, greater than 7 L   Much more sedate this morning and not asking for hospice.He just says he doesn't know what we should do.  TELEMETRY:  Sinus tachycardia on telemetry. No A. fib or other arrhythmia. Filed Vitals:   12/23/14 0000 12/23/14 0400 12/23/14 0500 12/23/14 0630  BP: 89/58 104/76  119/91  Pulse: 108 107  121  Temp: 97.5 F (36.4 C) 97.6 F (36.4 C)    TempSrc: Oral Oral    Resp: Height:      Weight:   266 lb 12.1 oz (121 kg)   SpO2: 90% 94%  91%    Intake/Output Summary (Last 24 hours) at 12/23/14 0821 Last data filed at 12/23/14 0500  Gross per 24 hour  Intake    350 ml  Output   7850 ml  Net  -7500 ml   LABS: Basic Metabolic Panel:  Recent Labs  56/21/30 0623 12/23/14 0316  NA 141 142  K 4.2 2.5*  CL 97* 95*  CO2 33* 38*  GLUCOSE 142* 125*  BUN 21* 12  CREATININE 1.09 0.83  CALCIUM 8.3* 8.1*   CBC:  Recent Labs  12/22/14 0623 12/23/14 0316  WBC 10.1 8.8  HGB 10.5* 10.5*  HCT 38.9* 38.7*  MCV 69.5* 69.9*  PLT 281 236   Cardiac Enzymes:  Recent Labs  12/20/14 0903 12/20/14 1650 12/20/14 1859  TROPONINI 0.03 0.03 0.04*   BNP    Component Value Date/Time   BNP 1896.0* 12/22/2014 0623    Echocardiogram: Dilated left ventricle with LVEF 15-20%. Moderate to severe mitral regurgitation. Dilated right ventricle. Severely reduced RV function. PA systolic pressure 41 mmHg.  Radiology/Studies:  CXR reveals CHF/pulmonary edema  Physical Exam: Blood pressure 119/91, pulse 121, temperature 97.6 F (36.4 C), temperature  source Oral, resp. rate 18, height  (1.702 m), weight 266 lb 12.1 oz (121 kg), SpO2 91 %. Weight change: -2 lb 10.3 oz (-1.2 kg)  Wt Readings from Last 3 Encounters:  12/23/14 266 lb 12.1 oz (121 kg)   Lying flat with nasal canula in place. Cheyne-Stokes respiratory pattern. Unable to assess neck veins. Decreased breath sounds at both bases with rales in the midlung field. S3 Gallop with pericardial friction rub and systolic murmur Bilateral LE edema   ASSESSMENT:  1. Acute on chronic systolic heart failure with pulmonary edema and right heart failure. Currently still volume overloaded but marked diuresis over night. 2. History of bipolar disorder and I question the patient's competence to make decisions for himself. 3. Cellulitis/scrotal infection with sepsis, resolving 4. Severe hypokalemia  Plan:  Continue diuresis to remove volume overload, but decrease intensity.  Long-acting nitrates and hydralazine as tolerated by blood pressure until diuresis is completed. Perhaps at that time angiotensin receptor blockade can be started.  Psychiatric consultation to determine competence.  If competent referral to hospice may not be a bad idea. He is already actively trying to die by not complying with medical therapy. I have no idea what his overall prognosis would be, but from the cardiac standpoint, long-term prognosis is very poor.  Should consider one time heart  failure team consult to advise about therapy and provide broader coverage to prevent readmissions.  Replete potassium.  Caryl Pina Rosina Lowenstein 12/23/2014, 8:21 AM

## 2014-12-23 NOTE — Progress Notes (Signed)
[facility] PROGRESS NOTE  Tom Reynolds AVW:[name]3-02-1966 DOA: 12/11/2014 PCP: No primary care provider on file.  Brief interval history HPI:  49 y.o. male accompanied by family, with a history of substance abuse, DM, HTN, alcoholic cardiomyopathy/chronic systolic heart failure with an EF of 20-25% and chronic kidney disease stage II with baseline creatinine of 1.2, who presented at [facility] with worsening shortness of breath over the last 2 weeks and worsening bilateral lower extremity edema associated with subjective fever, productive cough, genital pain and swelling of the scrotum. Patient also complained of chest pain that started 2 days ago, described as pleuritic in nature. Patient presented with similar complaints with worsening swelling of the scrotum and redness with difficulty urinating to [facility] ED on 9/27. On 9/27 patient was found by EMS naked in the local area and he was brought in but left AMA because of his court date. 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 Was found to have an elevated lactate, patient transferred to [facility] for further urology consult. CT scan done prior to transfer does not show evidence of Fournier's gangrene. Patient admitted to step down. Blood pressure soft 99 /63  Patient was admitted and placed in the step down unit. Patient was placed empirically on IV vancomycin IV Azactam for his lower extremity cellulitis and probable scrotal swelling and cellulitis. Patient was seen in consultation by urology who felt no was no abscess and felt  etiology of patient's scrotal swelling and cellulitis was secondary to poor hygiene and recommended continuation of IV antibiotics. Patient was also noted to be in acute renal failure and placed on IV fluids with some improvement in his renal function. Patient also noted to have anasarca from his lower extremities all the way up to his nipples. During the hospitalization patient was agitated belligerent and fighting with the staff and had to be placed in restraints and placed on IV Ativan and IV dilaudid. IV Dilaudid was discontinued. Patient was switched from the Ativan withdrawal protocol to the Valium withdrawal protocol. Psych was consulted to help with patient's agitation as well as capacity and patient did not have capacity. Patient was noted to have anasarca and be in acute on chronic systolic heart failure. 2-D echo which was done had a EF of 15-20%. Patient was diuresed with IV Lasix with good diuresis and cardiology was consulted and currently following. Patient is deemed to have no capacity and a such can be IVC for treatment. Patient will likely need skilled nursing facility on discharge. Patient's sister Tom Braun is a healthcare power of attorney.     Assessment/Plan:  #1 history of cardiomyopathy/acute on chronic systolic heart failure with EF of 15-20%per 2 d echo 12/19/2014 Patient not on any heart failure medications in the outpatient setting. Patient noted to be in acute on chronic heart failure with anasarca and now complains of shortness of breath this morning. Patient sounds wet on physical exam. Chest x-ray consistent with pulmonary edema. Cardiac enzymes slightly elevated however likely secondary to volume overload. 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 Patient early on in the  hospitalization was in acute renal failure and a such ACE inhibitor was discontinued. 2-D echo done on 12/19/2014 with a EF of 15-20% which is worse than prior  EF of 30%. Patient with moderate to severe mitral valvular regurgitation, severely dilated left atrium, moderately dilated right ventricle and severely reduced right ventricular systolic function. Moderately dilated right atrium. Moderate pulmonary valvular regurgitation. Patient was given Lasix 20 mg IV 1, 4 days ago with 1.025 L urine output. Patient's renal function has improved and as such was placed on Lasix 40 mg IV every 12 hours. Patient was - 7.140 L over the past 24 hours after increasing Lasix to 60 IV every 12 hours per cardiology. Strict I's and O's. Daily weights.CXR still with significant pulmonary edema. Will hold off on beta blocker at this time due to acute decompensation. Once acute decompensation has improved may consider placing on labetalol. Continue to hold off on ACE inhibitor due to recent acute renal failure during this hospitalization. IV Lasix dose has been decreased to cardiology to 40 mg IV  with a goal of net -2-3 L per day per cardiology. Patient was placed on the BiPAP 3 days ago and currently off BiPAP and resting comfortably. Cardiology following and appreciate input and recommendations.   #2 anasarca Questionable etiology. Likely secondary to cardiomyopathy/acute on chronic CHF exacerbation. Improving with diuresis, however still significantly volume overloaded. Patient does have a low albumen. Patient with normal LFTs. Patient however with prior history of cardiomyopathy and currently in acute on chronic systolic heart failure. Patient with complaints of shortness of breath. 2-D echo was done on 12/19/2014 with a depressed ejection fraction of 15-20% worse than prior EF of 30%. Patient also noted to have moderate to severe mitral valvular regurgitation, severely dilated left atrium moderately dilated right ventricle, severely reduced right ventricular systolic function, moderately dilated right atrium and moderate pulmonary valvular regurgitation.Blood pressure currently  stable. Patient is -7.140 L over the past 24 hours after being started on IV Lasix. Lasix dose has been decreased per cardiology.   #3 sepsis Patient was admitted with sepsis felt to be secondary to bilateral lower extremity cellulitis, scrotal cellulitis. On admission patient had elevated lactic acid level of 3.83 and was in acute on chronic renal failure with a creatinine going up as high as 2.32 on admission from a baseline of 1.26. Patient was pancultured. Urine cultures were negative. Blood cultures with 1/2 coag neg. staph likely a contaminant. Patient has been seen in consultation by urology recommend antibiotic and wound care treatment with outpatient follow-up. Patient was transitioned to IV clindamycin, however developed a rash and subsequently has been transitioned back to IV Azactam. Will discontinue IV Azactam. Saline lock IV fluids. Follow.  #4 scrotal swelling and cellulitis/ LE cellulitis Patient currently afebrile. WBC has trended down. Patient still with significant scrotal swelling. Patient has been seen in consultation by urology will feel the etiology of patient's swelling and cellulitis is secondary to poor hygiene. CT pelvis was negative for soft tissue gas or abscess. Patient status post Foley catheter placement with good output. KVO IV fluids. Patient was initially on IV vancomycin and IV Azactam and subsequently transitioned to IV clindamycin however developed a rash to his back, and now on IV Azactam. Scrotal elevation. Continue wound care dressings per wound care nurse and recommendations. Will discontinue IV Azactam. Will likely need follow-up with urology as outpatient.  #5 acute encephalopathy Likely multifactorial secondary to alcohol withdrawal and current infection. Patient less agitated after changing Ativan  to Valium for the withdrawal protocol. Dilaudid has been discontinued. QTC is normal. At family's insistence yesterday Haldol has been discontinued. Patient was  aggressive and had to be given some Haldol 3 days ago. Patient does state he has a prior history of bipolar disorder which was verified on care everywhere however currently on no medications. Patient states supposed to be on Zoloft 200 mg daily. Patient has been seen in consultation by psychiatry and Haldol dose was decreased to 3 g IV every 6 hours as needed, Ativan 1 mg every 6 hours as needed for restlessness and agitation and to continue Valium 5 mg IV as needed for uncontrollable anxiety/panic attacks per psych recommendations. At family's insistence Haldol has been discontinued. Psychiatry following and appreciate input and recommendations.  #6 acute renal failure chronic kidney disease stage II Baseline renal function is 1.26. On admission patient was up to 2.32. Renal function improved and creatinine currently at 0.94. Likely secondary to acute on chronic CHF exacerbation. Patient however also noted to be volume loaded on examination. Patient with urine output of 7.140 L over the past 24 hours. Continue to hold nephrotoxic agents. Lasix dose has been decreased per cardiology.   #7 hyponatremia Resolved.  #8 ETOH abuse Patient likely going through withdrawal with agitation. Per nursing improvement with agitation with change from ativan to valium on withdrawal protocol. Thiamine. Folic acid. Follow.  #9 bipolar disorder Patient with a history of bipolar disorder per patient and verified on care everywhere from her PCPs note. Patient states was on Zoloft 200 mg daily. Patient with improvement in agitation and aggressive behavior. Patient has been seen in consultation by psychiatry who recommended continuing safety sitter. Haldol has been discontinued  at family's insistence. Ativan 1 mg every 6 hours as needed for restlessness and agitation. Was recommended to continue Valium 5 mg IV as needed for uncontrollable anxiety/panic attacks. Appreciate psychiatric input.   #10 gastroesophageal reflux  disease Pepcid.  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 Will need repeat thyroid function studies in about 4-6 weeks post discharge.  #12 hypokalemia/hypomagnesemia Hypokalemia likely secondary to diureses. Repleted. Keep magnesium greater than 2.  #13 prophylaxis Pepcid for GI prophylaxis. Heparin for DVT prophylaxis.  #14 prognosis Patient being treated for cellulitis and scrotal swelling as well as acute on chronic systolic heart failure EF of 15-20% per 2-D echo. Patient with anasarca. Patient also with a history of polysubstance abuse. Patient with improvement with his cellulitis on empiric antibiotics. Patient on IV Lasix with good diuresis and some improvement with his anasarca however still with volume overloaded. Patient alert to self place and time. Patient asking to be discharged home, asking for medical treatment to be discontinued and wanted to go home with hospice yesterday. Call and spoke with sister who is the healthcare power of attorney, Lyla Son [phone] yesterday, who is asking that patient be possibly IVC in order to be treated. Patient has been reassessed by psychiatry and feel patient DOESNOT have capacity. Will also consulted palliative care for goals of care.   Code Status: Full Family Communication: No family at bedside.  sister via telephoneClydie Braun [facility] [phone]) Disposition Plan: Remain the step down unit   Consultants:  Urology: Dr. Neva Seat 12/12/2014  Psychiatry: Dr. Elsie Saas 12/19/2014  Cardiology: Dr. Wyline Mood 12/20/2014  Palliative Care : Dr Linna Darner 12/22/2014  Procedures:  CT pelvis 12/12/2014  Chest x-ray 12/11/2014  2-D echo 12/19/2014  Antibiotics:  IV ciprofloxacin 12/11/2014>>> 9/29/ 2016  IV clindamycin 12/11/2014>>> 12/11/2014  IV clindamycin 12/16/2014>>>> 12/17/2014  IV Azactam 12/12/2014>>> 12/15/2014  IV Azactam 12/17/2014>>>> 12/23/2014  IV vancomycin 12/11/2014>>>>  12/15/2014  HPI/Subjective: Patient off BiPAP overnight. Patient alert oriented to self place and time. Patient states breathing has improved.  Objective: Filed Vitals:   12/23/14 1000  BP: 114/93  Pulse: 116  Temp:   Resp: 11    Intake/Output Summary (Last 24 hours) at 12/23/14 1038 Last data filed at 12/23/14 0500  Gross per 24 hour  Intake    350 ml  Output   7450 ml  Net  -7100 ml   Filed Weights   12/21/14 0500 12/22/14 0500 12/23/14 0500  Weight: 124.8 kg (275 lb 2.2 oz) 122.2 kg (269 lb 6.4 oz) 121 kg (266 lb 12.1 oz)    Exam:   General:  Off BIPAP  Cardiovascular: Tachycardia with 3/6 SEM  Respiratory: Decreased BS in bases. Scattered crackles. No wheezing.  Abdomen: Soft, slight distention, positive bowel sounds, no rebound, no guarding. Rash noted on the back in the lower torso improved. scrotal wound with eschar. Scrotal swelling  Musculoskeletal: No clubbing or cyanosis. 2+ bilateral lower extremity edema up to mid abdomen improving. Right lower extremity with decreased erythema, wounds ( 2.5 x 2.5 , and 1.5 x 1.5cm) with sloughed/eschar minimal amount of yellow drainage noted on the right lower extremity.  Data Reviewed: Basic Metabolic Panel:  Recent Labs Lab 12/17/14 0821 12/18/14 0425 12/19/14 0328 12/20/14 0308 12/21/14 1055 12/22/14 0623 12/23/14 0316  NA 139 136 138 139 137 141 142  K 4.7 4.5 4.9 4.8 4.2 4.2 2.5*  CL 102 98* 99* 103 101 97* 95*  CO2 23 25 25 25 23  33* 38*  GLUCOSE 106* 148* 141* 141* 141* 142* 125*  BUN 31* 27* 26* 24* 23* 21* 12  CREATININE 1.49* 1.37* 1.18 1.07 1.10 1.09 0.83  CALCIUM 8.7* 8.5* 8.8* 8.4* 8.3* 8.3* 8.1*  MG 2.1 2.1 2.1 2.0  --   --  1.3*   Liver Function Tests:  Recent Labs Lab 12/19/14 0328  AST 39  ALT 13*  ALKPHOS 200*  BILITOT 2.3*  PROT 6.2*  ALBUMIN 1.8*   No results for input(s): LIPASE, AMYLASE in the last 168 hours.  Recent Labs Lab 12/16/14 1644  AMMONIA 37*   CBC:  Recent  Labs Lab 12/19/14 0328 12/20/14 0308 12/21/14 1055 12/22/14 0623 12/23/14 0316  WBC 11.1* 9.3 12.2* 10.1 8.8  HGB 11.4* 10.4* 11.0* 10.5* 10.5*  HCT 40.2 37.2* 40.3 38.9* 38.7*  MCV 67.3* 67.8* 69.7* 69.5* 69.9*  PLT 332 257 292 281 236   Cardiac Enzymes:  Recent Labs Lab 12/20/14 0903 12/20/14 1650 12/20/14 1859  TROPONINI 0.03 0.03 0.04*   BNP (last 3 results)  Recent Labs  12/20/14 0903 12/21/14 1055 12/22/14 0623  BNP 2510.0* 1741.5* 1896.0*    ProBNP (last 3 results) No results for input(s): PROBNP in the last 8760 hours.  CBG:  Recent Labs Lab 12/22/14 1126 12/22/14 1438 12/22/14 1751 12/22/14 2159 12/23/14 0821  GLUCAP 181* 143* 127* 105* 126*    No results found for this or any previous visit (from the past 240 hour(s)).   Studies: Dg Chest Port 1 View  12/22/2014   CLINICAL DATA:  Shortness of Breath  EXAM: PORTABLE CHEST 1 VIEW  COMPARISON:  December 20, 2014  FINDINGS: There is interstitial edema with right pleural effusion and cardiomegaly. There is pulmonary venous hypertension. There is atelectatic change in the bases. No  adenopathy. No bone lesions  IMPRESSION: Congestive heart failure. No appreciable change from recent prior study.   Electronically Signed   By: Bretta Bang III M.D.   On: 12/22/2014 07:00    Scheduled Meds: . aztreonam  1 g Intravenous 3 times per day  . collagenase   Topical Daily  . docusate sodium  100 mg Oral BID  . famotidine  20 mg Oral BID  . feeding supplement (ENSURE ENLIVE)  237 mL Oral BID BM  . folic acid  1 mg Oral Daily  . furosemide  40 mg Intravenous BID  . heparin  5,000 Units Subcutaneous 3 times per day  . insulin aspart  0-9 Units Subcutaneous TID WC  . magnesium sulfate 1 - 4 g bolus IVPB  4 g Intravenous Once  . multivitamin with minerals  1 tablet Oral Daily  . potassium chloride  40 mEq Oral Q4H  . [START ON 12/24/2014] potassium chloride  40 mEq Oral BID  . thiamine  100 mg Oral Daily    Continuous Infusions:    Principal Problem:   Acute on chronic systolic heart failure (HCC) Active Problems:   Scrotal swelling   Acute encephalopathy   Sepsis (HCC)   Acute renal failure superimposed on stage 3 chronic kidney disease (HCC)   Hyponatremia   OSA (obstructive sleep apnea)   Cellulitis of leg, right   Anasarca   Cellulitis   Blood poisoning (HCC)   Rash and nonspecific skin eruption   Bipolar disorder (HCC)   Euthyroid sick syndrome   Palliative care encounter    Time spent: 40 mins    [facility] MD [facility] Pager [phone]. If 7PM-7AM, please contact night-coverage at [email or web address], password [facility] 12/23/2014, 10:38 AM  LOS: 11 days

## 2014-12-23 NOTE — Progress Notes (Signed)
Patient continues to be agitated and anxious,attempting to pull wrist restraints loose from the bed,as well as pulling at his foley. Although patient has been educated with multiple reinforcements about leaving his O2 in place he continues to pull it out and refuse having it put back in stating,"he cannot breathe with it in".RN called into room by NT/sitter due to patients continued yelling "help". When asked what was wrong patient requested something for anxiety. Anxiety med given, will continue to monitor patient. Luther Parody Lum Keas

## 2014-12-23 NOTE — Progress Notes (Signed)
CRITICAL VALUE ALERT  Critical value received:  K 2.5  Date of notification:  12/23/2014   Time of notification:  4:33 AM   Critical value read back:Yes.    Nurse who received alert:  Cresenciano Lick, RN  MD notified (1st page):  Schorr, NP  Time of first page:  4:33 AM   Responding MD:  Clearence Ped, NP  Time MD responded:  4:34 AM

## 2014-12-23 NOTE — Progress Notes (Signed)
Patient continues to be very agitated and pulling at IV line, foley and telemetry leads. One to one sitter still in place RN and NT/Sitter attempt to reorient patient but are unsuccessful. Patient continues to be in bilateral soft wrist restraints and bedrails up X4.RN will continue to monitor patient. C.Jashua Knaak,RN

## 2014-12-23 NOTE — Progress Notes (Signed)
BiPAP on standby patient is on 3LNC SATS 98%

## 2014-12-23 NOTE — Progress Notes (Signed)
Patient refuses to leave [place] oxygen in, patient desats to the 70's when O2 is not on and reason for the need of the oxygen has beed explained to the patient who continues to state "I don't care". RN will continue to encourage patient to leave O2 in place and monitor patient. C.Elyan Vanwieren,RN

## 2014-12-24 DIAGNOSIS — N183 Chronic kidney disease, stage 3 (moderate): Secondary | ICD-10-CM

## 2014-12-24 DIAGNOSIS — N179 Acute kidney failure, unspecified: Secondary | ICD-10-CM

## 2014-12-24 LAB — BASIC METABOLIC PANEL
Anion gap: 12 (ref 5–15)
BUN: 15 mg/dL (ref 6–20)
CHLORIDE: 92 mmol/L — AB (ref 101–111)
CO2: 36 mmol/L — AB (ref 22–32)
CREATININE: 0.98 mg/dL (ref 0.61–1.24)
Calcium: 8 mg/dL — ABNORMAL LOW (ref 8.9–10.3)
GFR calc Af Amer: >60 mL/min (ref >60)
GFR calc non Af Amer: >60 mL/min (ref >60)
GLUCOSE: 119 mg/dL — AB (ref 65–99)
POTASSIUM: 4.3 mmol/L (ref 3.5–5.1)
Sodium: 140 mmol/L (ref 135–145)

## 2014-12-24 LAB — CBC
HCT: 41.5 % (ref 39.0–52.0)
HEMOGLOBIN: 11.1 g/dL — AB (ref 13.0–17.0)
MCH: 18.9 pg — AB (ref 26.0–34.0)
MCHC: 26.7 g/dL — AB (ref 30.0–36.0)
MCV: 70.7 fL — AB (ref 78.0–100.0)
Platelets: 328 10*3/uL (ref 150–400)
RBC: 5.87 MIL/uL — ABNORMAL HIGH (ref 4.22–5.81)
RDW: 23.3 % — ABNORMAL HIGH (ref 11.5–15.5)
WBC: 9.3 10*3/uL (ref 4.0–10.5)

## 2014-12-24 LAB — BRAIN NATRIURETIC PEPTIDE: B Natriuretic Peptide: 1749 pg/mL — ABNORMAL HIGH (ref 0.0–100.0)

## 2014-12-24 LAB — GLUCOSE, CAPILLARY
GLUCOSE-CAPILLARY: 146 mg/dL — AB (ref 65–99)
GLUCOSE-CAPILLARY: 155 mg/dL — AB (ref 65–99)
Glucose-Capillary: 129 mg/dL — ABNORMAL HIGH (ref 65–99)
Glucose-Capillary: 184 mg/dL — ABNORMAL HIGH (ref 65–99)

## 2014-12-24 LAB — MAGNESIUM: Magnesium: 1.9 mg/dL (ref 1.7–2.4)

## 2014-12-24 MED ORDER — METOPROLOL TARTRATE 25 MG PO TABS: 25.0000 mg | ORAL_TABLET | Freq: Two times a day (BID) | ORAL | Status: DC

## 2014-12-24 MED ORDER — FUROSEMIDE 10 MG/ML IJ SOLN
80.0000 mg | Freq: Two times a day (BID) | INTRAMUSCULAR | Status: DC
  Administered 2014-12-24 – 2014-12-25 (×2): 80 mg via INTRAVENOUS
  Filled 2014-12-24 (×2): qty 8

## 2014-12-24 MED ORDER — LISINOPRIL 2.5 MG PO TABS
2.5000 mg | ORAL_TABLET | Freq: Every day | ORAL | Status: DC
Start: 2014-12-24 — End: 2014-12-25
  Administered 2014-12-24: 2.5 mg via ORAL
  Filled 2014-12-24 (×2): qty 1

## 2014-12-24 MED ORDER — SENNA 8.6 MG PO TABS
1.0000 | ORAL_TABLET | Freq: Every day | ORAL | Status: DC
  Administered 2014-12-24 – 2014-12-25 (×2): 8.6 mg via ORAL
  Filled 2014-12-24 (×2): qty 1

## 2014-12-24 MED ORDER — METOPROLOL SUCCINATE 12.5 MG HALF TABLET
12.5000 mg | ORAL_TABLET | Freq: Every day | ORAL | Status: DC
  Filled 2014-12-24: qty 1

## 2014-12-24 MED ORDER — CARVEDILOL 3.125 MG PO TABS
3.1250 mg | ORAL_TABLET | Freq: Two times a day (BID) | ORAL | Status: DC
  Administered 2014-12-24 – 2014-12-28 (×6): 3.125 mg via ORAL
  Filled 2014-12-24 (×7): qty 1

## 2014-12-24 MED ORDER — MORPHINE SULFATE (PF) 2 MG/ML IV SOLN
2.0000 mg | INTRAVENOUS | Status: DC | PRN
  Administered 2014-12-24 – 2014-12-27 (×7): 2 mg via INTRAVENOUS
  Filled 2014-12-24 (×7): qty 1

## 2014-12-24 NOTE — Progress Notes (Signed)
Occupational Therapy Treatment Patient Details Name: Tom RochesterStephen Reynolds MRN: [id number] DOB: 1965-03-28 Today's Date: 12/24/2014    History of present illness 49 y.o. with a history of substance abuse, DM, HTN, alcoholic cardiomyopathy/chronic systolic heart failure with an EF of 20-25% and chronic kidney disease stage II, admitte4d with worsening shortness of breath over the last 2 weeks and worsening bilateral lower extremity edema associated with subjective fever, productive cough, genital pain and swelling of the scrotum as well as encephalopathy   OT comments  This 49 yo male making progress but is impulsive and can be easily agitated. He did take some steps today, but sat without us being ready. He will continue to benefit from acute OT with follow up at SNF.   Follow Up Recommendations  SNF    Equipment Recommendations   (TBD next venue)       Precautions / Restrictions Precautions Precautions: Fall Precaution Comments: scrotal edema Restrictions Weight Bearing Restrictions: No       Mobility Bed Mobility Overal bed mobility: Needs Assistance Bed Mobility: Supine to Sit     Supine to sit: Min assist;HOB elevated     General bed mobility comments: VCs for hand placement  Transfers Overall transfer level: Needs assistance Equipment used: Rolling walker (2 wheeled) Transfers: Sit to/from [employer]GI CorporationStand;Stand Pivot Transfers Sit to Stand: Mod assist;+2 physical assistance Stand pivot transfers: +2 physical assistance;Mod assist            Balance Overall balance assessment: Needs assistance Sitting-balance support: Bilateral upper extremity supported;Feet supported Sitting balance-Leahy Scale: Fair Sitting balance - Comments: tends to want to lean anteriorly   Standing balance support: Bilateral upper extremity supported Standing balance-Leahy Scale: Poor                     ADL Overall ADL's : Needs assistance/impaired     Grooming: Wash/dry face;Set  up;Supervision/safety;Sitting               Lower Body Dressing: Total assistance (to doff socks)   Toilet Transfer: Moderate assistance;+2 for physical assistance;Stand-pivot (bed>recliner)                              Cognition   Behavior During Therapy: Agitated;Impulsive;Restless Overall Cognitive Status: No family/caregiver present to determine baseline cognitive functioning Area of Impairment: Orientation;Safety/judgement Orientation Level: Disoriented to;Place        Safety/Judgement: Decreased awareness of safety;Decreased awareness of deficits     General Comments: Pt was ambualting with us towards the door and he stated that his socks were no good (keeping him from walking better) so without warning he sat himself down in the recliner (that was not locked and was not behind him, but rather off to the side and only getting himself 75% in to recliner.). When I told him that it was not safe to do this his response was, "I had it". Explained to him that he needed to let us know when he needed to sit so we could be prepared to help him safely do so.                 Pertinent Vitals/ Pain       Pain Assessment: No/denies pain         Frequency Min 2X/week     Progress Toward Goals  OT Goals(current goals can now be found in the care plan section)  Progress towards OT goals: Progressing toward goals  Plan Discharge plan remains appropriate    Co-evaluation    PT/OT/SLP Co-Evaluation/Treatment: Yes Reason for Co-Treatment: Complexity of the patient's impairments (multi-system involvement)   OT goals addressed during session: ADL's and self-care;Strengthening/ROM      End of Session Equipment Utilized During Treatment: Gait belt;Rolling walker   Activity Tolerance Patient limited by fatigue   Patient Left in chair;with call bell/phone within reach;with chair alarm set (with sitter and Bil wrist restraints reapplied (nursing OK'd to leave  mitts off while sittier with pt))   Nurse Communication  (Ok'd to leave mitts of while sitter with pt as long as pt cooperting)        Time: 1610-9604 OT Time Calculation (min): 39 min  Charges: OT General Charges $OT Visit: 1 Procedure OT Treatments $Therapeutic Activity: 8-22 mins  Evette Georges [phone] 12/24/2014, 12:43 PM

## 2014-12-24 NOTE — Progress Notes (Signed)
       Patient Name: Tom Reynolds Date of Encounter: 12/24/2014    SUBJECTIVE: He is calmer. I have read and agree with Dr. Carollee Massedhompson's note.  TELEMETRY:  A fib Filed Vitals:   12/24/14 0000 12/24/14 0200 12/24/14 0400 12/24/14 0600  BP: 103/77 111/92 109/82 104/79  Pulse:  48 118   Temp: 97.5 F (36.4 C)  97.8 F (36.6 C)   TempSrc: Axillary  Axillary   Resp: 23 20 23 16   Height:      Weight:    257 lb 8 oz (116.8 kg)  SpO2: 100% 93% 83%     Intake/Output Summary (Last 24 hours) at 12/24/14 1028 Last data filed at 12/24/14 0400  Gross per 24 hour  Intake    610 ml  Output   2250 ml  Net  -1640 ml  Significant slowing of diuresis after lasix adjustment.    LABS: Basic Metabolic Panel:  Recent Labs  16/12/9608/11/16 0316 12/23/14 1835 12/24/14 0355  NA 142 141 140  K 2.5* 3.6 4.3  CL 95* 95* 92*  CO2 38* 35* 36*  GLUCOSE 125* 129* 119*  BUN 12 16 15   CREATININE 0.83 0.94 0.98  CALCIUM 8.1* 8.0* 8.0*  MG 1.3*  --  1.9   CBC:  Recent Labs  12/23/14 0316 12/24/14 0355  WBC 8.8 9.3  HGB 10.5* 11.1*  HCT 38.7* 41.5  MCV 69.9* 70.7*  PLT 236 328     Radiology/Studies:  No New data  Physical Exam: Blood pressure 104/79, pulse 118, temperature 97.8 F (36.6 C), temperature source Axillary, resp. rate 16, height 5\' 7"  (1.702 m), weight 257 lb 8 oz (116.8 kg), SpO2 83 %. Weight change: -9 lb 4.2 oz (-4.2 kg)  Wt Readings from Last 3 Encounters:  12/24/14 257 lb 8 oz (116.8 kg)   3-Component pericardial rub Lying flat Moderate lower extremity edema  ASSESSMENT:  1. Biventricular failure, with diuresis. Now > 8 L negative 2. Pericardial friction rub  Plan:  1. Continue same therapy but will increase lasix back to 60 mg BID  Selinda EonSigned, SMITH III,HENRY W 12/24/2014, 10:28 AM

## 2014-12-24 NOTE — Progress Notes (Signed)
[facility] PROGRESS NOTE  Tom RochesterStephen Sundeen FAO:[id number] DOB: January 09, 1966 DOA: 12/11/2014 PCP: No primary care provider on file.  Brief interval history HPI:  49 y.o. male accompanied by family, with a history of substance abuse, DM, HTN, alcoholic cardiomyopathy/chronic systolic heart failure with an EF of 20-25% and chronic kidney disease stage II with baseline creatinine of 1.2, who presented at [facility] with worsening shortness of breath over the last 2 weeks and worsening bilateral lower extremity edema associated with subjective fever, productive cough, genital pain and swelling of the scrotum. Patient also complained of chest pain that started 2 days ago, described as pleuritic in nature. Patient presented with similar complaints with worsening swelling of the scrotum and redness with difficulty urinating to [facility] ED on 9/27. On 9/27 patient was found by EMS naked in the local area and he was brought in but left AMA because of his court date. 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 Was found to have an elevated lactate, [facility]. CT scan done prior to transfer does not show evidence of Fournier's gangrene. Patient admitted to step down. Blood pressure soft 99 /63  Patient was admitted and placed in the step down unit. Patient was placed empirically on IV vancomycin IV Azactam for his lower extremity cellulitis and probable scrotal swelling and cellulitis. Patient was seen in consultation by urology who felt no was no abscess and felt  etiology of patient's scrotal swelling and cellulitis was secondary to poor hygiene and recommended continuation of IV antibiotics. Patient was also noted to be in acute renal failure and placed on IV fluids with some improvement in his renal function. Patient also noted to have anasarca from his lower extremities all the way up to his nipples. During the hospitalization patient was agitated belligerent and fighting with the staff and had to be placed in restraints and placed on IV Ativan and IV dilaudid. IV Dilaudid was discontinued. Patient was switched from the Ativan withdrawal protocol to the Valium withdrawal protocol. Psych was consulted to help with patient's agitation as well as capacity and patient did not have capacity. Patient was noted to have anasarca and be in acute on chronic systolic heart failure. 2-D echo which was done had a EF of 15-20%. Patient was diuresed with IV Lasix with good diuresis and cardiology was consulted and currently following. Patient is deemed to have no capacity and a such can be IVC for treatment. Patient will likely need skilled nursing facility on discharge. Patient's sister Clydie BraunKaren is a healthcare power of attorney.     Assessment/Plan:  history of alcoholic cardiomyopathy/acute on chronic systolic heart failure with  - EF of 15-20%per 2 d echo 12/19/2014 - With history of noncompliance, recent [facility], not taking any of his home medication. - No ACE inhibitor or ARB currently given initially with acute renal failure. - Initially requiring BiPAP, currently off BiPAP after aggressive diuresis. - We'll start on low-dose metoprolol, patient in the presence of mild tachycardia, would consider them and doing nitrate or hydralazine if blood pressure allows after starting beta  blockers and increaseD diuresis as per cardiology recommendation. - Requested CHF consult as per cardiology recommendation. - Long-term prognosis is  very poor per cardiology  anasarca Questionable etiology. Likely secondary to cardiomyopathy/acute on chronic CHF exacerbation. Improving with diuresis, however still significantly volume overloaded. Patient does have a low albumen. Patient with normal LFTs. Patient however with prior history of cardiomyopathy and currently in acute on chronic systolic heart failure. Patient with complaints of shortness of breath. 2-D echo was done on 12/19/2014 with a depressed ejection fraction of 15-20% worse than prior EF of 30%. Patient also noted to have moderate to severe mitral valvular regurgitation, severely dilated left atrium moderately dilated right ventricle, severely reduced right ventricular systolic function, moderately dilated right atrium and moderate pulmonary valvular regurgitation.Blood pressure currently stable. Diuresis managed by cardiology  sepsis Patient was admitted with sepsis felt to be secondary to bilateral lower extremity cellulitis, scrotal cellulitis. On admission patient had elevated lactic acid level of 3.83 and was in acute on chronic renal failure with a creatinine going up as high as 2.32 on admission from a baseline of 1.26. Patient was pancultured. Urine cultures were negative. Blood cultures with 1/2 coag neg. staph likely a contaminant. Patient has been seen in consultation by urology recommend antibiotic and wound care treatment with outpatient follow-up. Patient was transitioned to IV clindamycin, however developed a rash and subsequently has been transitioned back to IV Azactam. Currently off IV antibiotics .   scrotal swelling and cellulitis/ LE cellulitis Patient currently afebrile. WBC has trended down. Patient still with significant scrotal swelling. Patient has been seen in consultation by urology will feel the etiology of patient's swelling and cellulitis is secondary to poor hygiene. CT pelvis was negative for soft tissue gas or abscess. Patient status post Foley  catheter placement with good output. KVO IV fluids. Patient was initially on IV vancomycin and IV Azactam and subsequently transitioned to IV clindamycin however developed a rash to his back, and now on IV Azactam. Scrotal elevation. Continue wound care dressings per wound care nurse and recommendations. Currently off IV Azactam. Will likely need follow-up with urology as outpatient.   acute encephalopathy multifactorial secondary to alcohol withdrawal , metabolic related to infection initially, Patient less agitated after changing Ativan to Valium for the withdrawal protocol. Dilaudid has been discontinued. QTC is normal. At family's insistence yesterday Haldol has been discontinued. Patient was aggressive and had to be given some Haldol 3 days ago. Patient does state he has a prior history of bipolar disorder which was verified on care everywhere however currently on no medications. Patient states supposed to be on Zoloft 200 mg daily. Patient has been seen in consultation by psychiatry and Haldol dose was decreased to 3 g IV every 6 hours as needed, Ativan 1 mg every 6 hours as needed for restlessness and agitation and to continue Valium 5 mg IV as needed for uncontrollable anxiety/panic attacks per psych recommendations. At family's insistence Haldol has been discontinued. Psychiatry following and appreciate input and recommendations. - We'll obtain CT head   acute renal failure chronic kidney disease stage II Baseline renal function is 1.26. On admission patient was up to 2.32. Renal function improved and creatinine currently at 0.94. Likely secondary to acute on chronic CHF exacerbation. Patient however also noted to be volume loaded on examination.  Continue to hold nephrotoxic agents.    hyponatremia Resolved.   ETOH abuse Patient likely going through withdrawal with agitation. Per nursing improvement with agitation with change from ativan  to valium on withdrawal protocol. Thiamine. Folic acid.  Follow.   bipolar disorder Patient with a history of bipolar disorder per patient and verified on care everywhere from her PCPs note. Patient states was on Zoloft 200 mg daily. Patient with improvement in agitation and aggressive behavior. Patient has been seen in consultation by psychiatry who recommended continuing safety sitter. Haldol has been discontinued  at family's insistence. Ativan 1 mg every 6 hours as needed for restlessness and agitation. Was recommended to continue Valium 5 mg IV as needed for uncontrollable anxiety/panic attacks. Appreciate psychiatric input.    gastroesophageal reflux disease Pepcid.   euthyroid sick syndrome TSH is elevated however free T4 within normal limits. Will need repeat thyroid function studies in about 4-6 weeks post discharge.   hypokalemia/hypomagnesemia Hypokalemia likely secondary to diureses. Repleted. Keep magnesium greater than 2.   prophylaxis Pepcid for GI prophylaxis. Heparin for DVT prophylaxis.   prognosis Patient being treated for cellulitis and scrotal swelling as well as acute on chronic systolic heart failure EF of 15-20% per 2-D echo. Patient with anasarca. Patient also with a history of polysubstance abuse. Patient with improvement with his cellulitis on empiric antibiotics. Patient on IV Lasix with good diuresis and some improvement with his anasarca however still with volume overloaded. Patient alert to self place and time. Patient asking to be discharged home, asking for medical treatment to be discontinued and wanted to go home with hospice , patient does not have capacity as per psychiatry, Dr. Janee Morn spoke with sister who is the healthcare power of attorney, Lyla Son [phone]  who is asking that patient be possibly IVC in order to be treated. Patient has been reassessed by psychiatry and feel patient DOESNOT have capacity. consulted palliative care for goals of care.   Code Status: Full Family Communication: No family at  bedside.   Disposition Plan: Remain the step down unit   Consultants:  Urology: Dr. Neva Seat 12/12/2014  Psychiatry: Dr. Elsie Saas 12/19/2014  Cardiology: Dr. Wyline Mood 12/20/2014  Palliative Care : Dr Linna Darner 12/22/2014  Procedures:  CT pelvis 12/12/2014  Chest x-ray 12/11/2014  2-D echo 12/19/2014  Antibiotics:  IV ciprofloxacin 12/11/2014>>> 9/29/ 2016  IV clindamycin 12/11/2014>>> 12/11/2014  IV clindamycin 12/16/2014>>>> 12/17/2014  IV Azactam 12/12/2014>>> 12/15/2014  IV Azactam 12/17/2014>>>> 12/23/2014  IV vancomycin 12/11/2014>>>> 12/15/2014  HPI/Subjective: Patient off BiPAP overnight. Patient alert oriented to self, place . Patient denies any complaints.  Objective: Filed Vitals:   12/24/14 0600  BP: 104/79  Pulse:   Temp:   Resp: 16    Intake/Output Summary (Last 24 hours) at 12/24/14 1044 Last data filed at 12/24/14 0400  Gross per 24 hour  Intake    610 ml  Output   2250 ml  Net  -1640 ml   Filed Weights   12/22/14 0500 12/23/14 0500 12/24/14 0600  Weight: 122.2 kg (269 lb 6.4 oz) 121 kg (266 lb 12.1 oz) 116.8 kg (257 lb 8 oz)    Exam:   General:  Off BIPAP  Cardiovascular: Tachycardia with 3/6 SEM  Respiratory: Decreased BS in bases. Scattered crackles. No wheezing.  Abdomen: Soft, slight distention, positive bowel sounds, no rebound, no guarding. Rash noted on the back in the lower torso improved. scrotal wound with eschar. Scrotal swelling  Musculoskeletal: No clubbing or cyanosis. 2+ bilateral lower extremity edema up to mid abdomen improving. Right lower extremity with decreased erythema, wounds ( 2.5 x 2.5 , and 1.5 x 1.5cm) with sloughed/eschar minimal amount of yellow drainage noted on  the right lower extremity.  Data Reviewed: Basic Metabolic Panel:  Recent Labs Lab 12/18/14 0425 12/19/14 0328 12/20/14 0308 12/21/14 1055 12/22/14 0623 12/23/14 0316 12/23/14 1835 12/24/14 0355  NA 136 138 139 137 141 142 141 140   K 4.5 4.9 4.8 4.2 4.2 2.5* 3.6 4.3  CL 98* 99* 103 101 97* 95* 95* 92*  CO2 33* 38* 35* 36*  GLUCOSE 148* 141* 141* 141* 142* 125* 129* 119*  BUN 27* 26* 24* 23* 21* CREATININE 1.37* 1.18 1.07 1.10 1.09 0.83 0.94 0.98  CALCIUM 8.5* 8.8* 8.4* 8.3* 8.3* 8.1* 8.0* 8.0*  MG 2.1 2.1 2.0  --   --  1.3*  --  1.9   Liver Function Tests:  Recent Labs Lab 12/19/14 0328  AST 39  ALT 13*  ALKPHOS 200*  BILITOT 2.3*  PROT 6.2*  ALBUMIN 1.8*   No results for input(s): LIPASE, AMYLASE in the last 168 hours. No results for input(s): AMMONIA in the last 168 hours. CBC:  Recent Labs Lab 12/20/14 0308 12/21/14 1055 12/22/14 0623 12/23/14 0316 12/24/14 0355  WBC 9.3 12.2* 10.1 8.8 9.3  HGB 10.4* 11.0* 10.5* 10.5* 11.1*  HCT 37.2* 40.3 38.9* 38.7* 41.5  MCV 67.8* 69.7* 69.5* 69.9* 70.7*  PLT 257 292 281 236 328   Cardiac Enzymes:  Recent Labs Lab 12/20/14 0903 12/20/14 1650 12/20/14 1859  TROPONINI 0.03 0.03 0.04*   BNP (last 3 results)  Recent Labs  12/21/14 1055 12/22/14 0623 12/24/14 0355  BNP 1741.5* 1896.0* 1749.0*    ProBNP (last 3 results) No results for input(s): PROBNP in the last 8760 hours.  CBG:  Recent Labs Lab 12/23/14 0821 12/23/14 1241 12/23/14 1654 12/23/14 2156 12/24/14 0737  GLUCAP 126* 145* 118* 125* 129*    No results found for this or any previous visit (from the past 240 hour(s)).   Studies: No results found.  Scheduled Meds: . collagenase   Topical Daily  . docusate sodium  100 mg Oral BID  . famotidine  20 mg Oral BID  . feeding supplement (ENSURE ENLIVE)  237 mL Oral BID BM  . folic acid  1 mg Oral Daily  . furosemide  40 mg Intravenous BID  . heparin  5,000 Units Subcutaneous 3 times per day  . insulin aspart  0-9 Units Subcutaneous TID WC  . multivitamin with minerals  1 tablet Oral Daily  . potassium chloride  40 mEq Oral BID  . thiamine  100 mg Oral Daily   Continuous Infusions:    Principal  Problem:   Acute on chronic systolic heart failure (HCC) Active Problems:   Sepsis (HCC)   Acute renal failure superimposed on stage 3 chronic kidney disease (HCC)   Hyponatremia   OSA (obstructive sleep apnea)   Cellulitis of leg, right   Anasarca   Cellulitis   Scrotal swelling   Blood poisoning (HCC)   Rash and nonspecific skin eruption   Acute encephalopathy   Bipolar disorder (HCC)   Euthyroid sick syndrome   Palliative care encounter    Time spent: 40 mins    Darcus Edds MD [facility] Pager [phone]. If 7PM-7AM, please contact night-coverage at [email or web address], password [facility] 12/24/2014, 10:44 AM  LOS: 12 days

## 2014-12-24 NOTE — Progress Notes (Signed)
Physical Therapy Treatment Patient Details Name: Tom Reynolds MRN: [id number] DOB: [birth year] Today's Date: 12/24/2014    History of Present Illness 49 y.o. with a history of substance abuse, DM, HTN, alcoholic cardiomyopathy/chronic systolic heart failure with an EF of 20-25% and chronic kidney disease stage II, admitte4d with worsening shortness of breath over the last 2 weeks and worsening bilateral lower extremity edema associated with subjective fever, productive cough, genital pain and swelling of the scrotum as well as encephalopathy    PT Comments    Patient seen for therapy and progression of activity. Patient appears more alert and demonstrates some cognitive awareness. Patient remains non-compliant with supplemental O2 and non-receptive to instruction and cues. Patient very impulsive. Did tolerate OOB to chair and minimal in room activity including steps using RW but did required increased assist. Patient became very emotional throughout session as NP from palliative care team was present and attempting to engage with patient. At this time, will continue to see as indicated and progress as tolerated. Continue to recommend ST SNF if patient receptive.   Follow Up Recommendations  SNF;Supervision/Assistance - 24 hour     Equipment Recommendations  None recommended by PT    Recommendations for Other Services       Precautions / Restrictions Precautions Precautions: Fall Precaution Comments: scrotal edema Restrictions Weight Bearing Restrictions: No    Mobility  Bed Mobility Overal bed mobility: Needs Assistance Bed Mobility: Supine to Sit     Supine to sit: Min assist;HOB elevated     General bed mobility comments: VCs for hand placement  Transfers Overall transfer level: Needs assistance Equipment used: Rolling walker (2 wheeled) Transfers: Sit to/from [employer] Sit to Stand: Mod assist;+2 physical assistance Stand pivot transfers: +2 physical  assistance;Mod assist       General transfer comment: attempted cues for hand placement and safety, patient impulsive and non receptive  Ambulation/Gait Ambulation/Gait assistance: Mod assist;+2 physical assistance Ambulation Distance (Feet): 8 Feet Assistive device: Rolling walker (2 wheeled) Gait Pattern/deviations: Step-to pattern;Shuffle;Trunk flexed;Wide base of support     General Gait Details: impulsive with limited mobility, non functional steps in room them patient bailed from ambulation to chair with poor regard for safety with transition.    Stairs            Wheelchair Mobility    Modified Rankin (Stroke Patients Only)       Balance Overall balance assessment: Needs assistance Sitting-balance support: Bilateral upper extremity supported;Feet supported Sitting balance-Leahy Scale: Fair Sitting balance - Comments: tends to want to lean anteriorly   Standing balance support: Bilateral upper extremity supported Standing balance-Leahy Scale: Poor                      Cognition Arousal/Alertness: Awake/alert Behavior During Therapy: Agitated;Impulsive;Restless Overall Cognitive Status: No family/caregiver present to determine baseline cognitive functioning Area of Impairment: Orientation;Safety/judgement Orientation Level: Disoriented to;Place       Safety/Judgement: Decreased awareness of safety;Decreased awareness of deficits     General Comments: Pt was ambualting with [place] towards the door and he stated that his socks were no good (keeping him from walking better) so without warning he sat himself down in the recliner (that was not locked and was not behind him, but rather off to the side and only getting himself 75% in to recliner.). When I told him that it was not safe to do this his response was, "I had it". Explained to him that he needed to  let us know when he needed to sit so we could be prepared to help him safely do so.    Exercises       General Comments General comments (skin integrity, edema, etc.): increased bilateral LE edema      Pertinent Vitals/Pain Pain Assessment: No/denies pain    Home Living                      Prior Function            PT Goals (current goals can now be found in the care plan section) Acute Rehab PT Goals Patient Stated Goal: to go home PT Goal Formulation: With patient Time For Goal Achievement: 01/05/15 Potential to Achieve Goals: Fair Progress towards PT goals: Progressing toward goals (modestly)    Frequency  Min 3X/week    PT Plan Current plan remains appropriate    Co-evaluation PT/OT/SLP Co-Evaluation/Treatment: Yes Reason for Co-Treatment: Complexity of the patient's impairments (multi-system involvement);For patient/therapist safety PT goals addressed during session: Mobility/safety with mobility OT goals addressed during session: ADL's and self-care;Strengthening/ROM     End of Session Equipment Utilized During Treatment: Gait belt Activity Tolerance: Patient tolerated treatment well Patient left: in bed;with call bell/phone within reach;with nursing/sitter in room;with bed alarm set;with restraints reapplied (bil wrists)     Time: [id number] PT Time Calculation (min) (ACUTE ONLY): 39 min  Charges:  $Therapeutic Activity: 23-37 mins                    G CodesFabio Reynolds:      Tom Reynolds 12/24/2014, 3:05 PM Tom Reynolds, PT DPT  [phone]

## 2014-12-24 NOTE — Progress Notes (Signed)
Advanced Heart Failure Rounding Note  Referring Physician: Dr. 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 male with history of Chronic systolic HF Echo 16/1/09 LVEF 60-45% with severe dilation and severely reduced RV function, DM, HTN, CKD stage II (baseline cr 1.2), and polysubstance abuse (UDS 12/12/14 + for opiates and cocaine). He presented to [facility] on 12/11/14 with worsening SOB and peripheral and scrotal edema. He had been seen in the [facility] ED 2 days prior but left AMA.  Pertinent labs on admission included K 3.5, Creatinine 2.32, BNP 1897.6, UDS + cocaine + opiates, Lactic acid of 3.83. CXR with no active cardiopulmonary disease and cardiomegaly. US scrotum negative.  He has been treated with IV lasix and empiric antibiotics for sepsis in setting of cellulitis and ? Fournier's gangrene. Now off ABX. Had rash with clindamycin.  Pt initially wished for d/c of medical treatment and to proceed home with hospice. Was seen by psych and determined to NOT have capacity for decision making.  Pts sister, Tom Reynolds, is present. She is NOT legal HCPOA, but is currently making decisions for him.  So far he has diuresed 13.5 L overall and is down nearly 20 lbs. Currently on 40 mg IV lasix BID. Creatinine has gradually improved to WNL. Patient baseline weight unclear.  Initially refused to let me speak with or examine him.  Agreed as long as I did not talk to his sister.  States that he "has a place" outside of the hospital. States he was taking ASA, omeprazole, and lasix outside of the hospital. Repeats over and over again that he wishes to go home. He understands that he is very sick. Refuses to answer any questions.  Sister, Tom Reynolds, at bedside states that he is an active drug user and has no PCP or medicines prescribed to him.  States he would occasionally take their  mothers medication. She states that Psych saw him yesterday and said that he was "not capable of making any rash decisions."    Objective:   Weight Range: 257 lb 8 oz (116.8 kg) Body mass index is 40.32 kg/(m^2).   Vital Signs:   Temp:  [97.3 F (36.3 C)-97.8 F (36.6 C)] 97.8 F (36.6 C) (10/12 0400) Pulse Rate:  [48-118] 118 (10/12 0400) Resp:  [15-27] 16 (10/12 0600) BP: (103-111)/(76-92) 104/79 mmHg (10/12 0600) SpO2:  [79 %-100 %] 83 % (10/12 0400) Weight:  [257 lb 8 oz (116.8 kg)] 257 lb 8 oz (116.8 kg) (10/12 0600) Last BM Date: 12/21/14  Weight change: Filed Weights   12/22/14 0500 12/23/14 0500 12/24/14 0600  Weight: 269 lb 6.4 oz (122.2 kg) 266 lb 12.1 oz (121 kg) 257 lb 8 oz (116.8 kg)    Intake/Output:   Intake/Output Summary (Last 24 hours) at 12/24/14 1114 Last data filed at 12/24/14 0400  Gross per 24 hour  Intake    610 ml  Output   2250 ml  Net  -1640 ml     Physical Exam: General:  Chronically ill appearing. Soft restrains in place. HEENT: normal Neck: supple. JVP difficult to assess with pt size, but appears elevated. Carotids 2+ bilat; no bruits. No lymphadenopathy or thyromegaly appreciated. Cor: PMI nondisplaced. Regular rate & rhythm. No rubs, gallops. 2/6 MR murmur, pericardial friction rub.  Lungs: clear Abdomen: Obese, soft, nontender, nondistended. No HSM. No bruits or masses. +BS Extremities: Multiple  wounds and cellulitic changes. 3+ LE edema. Neuro: Alert and oriented x 3 (person, place, and disease state), cranial nerves grossly intact. moves all 4 extremities w/o difficulty. Affect flat, agitated  Telemetry: Sinus Tach 110s  Labs: CBC  Recent Labs  12/23/14 0316 12/24/14 0355  WBC 8.8 9.3  HGB 10.5* 11.1*  HCT 38.7* 41.5  MCV 69.9* 70.7*  PLT 236 328   Basic Metabolic Panel  Recent Labs  12/23/14 0316 12/23/14 1835 12/24/14 0355  NA 142 141 140  K 2.5* 3.6 4.3  CL 95* 95* 92*  CO2 38* 35* 36*  GLUCOSE 125* 129*  119*  BUN 12 16 15   CREATININE 0.83 0.94 0.98  CALCIUM 8.1* 8.0* 8.0*  MG 1.3*  --  1.9   Liver Function Tests No results for input(s): AST, ALT, ALKPHOS, BILITOT, PROT, ALBUMIN in the last 72 hours. No results for input(s): LIPASE, AMYLASE in the last 72 hours. Cardiac Enzymes No results for input(s): CKTOTAL, CKMB, CKMBINDEX, TROPONINI in the last 72 hours.  BNP: BNP (last 3 results)  Recent Labs  12/21/14 1055 12/22/14 0623 12/24/14 0355  BNP 1741.5* 1896.0* 1749.0*    ProBNP (last 3 results) No results for input(s): PROBNP in the last 8760 hours.   D-Dimer No results for input(s): DDIMER in the last 72 hours. Hemoglobin A1C No results for input(s): HGBA1C in the last 72 hours. Fasting Lipid Panel No results for input(s): CHOL, HDL, LDLCALC, TRIG, CHOLHDL, LDLDIRECT in the last 72 hours. Thyroid Function Tests No results for input(s): TSH, T4TOTAL, T3FREE, THYROIDAB in the last 72 hours.  Invalid input(s): FREET3  Other results:     Imaging/Studies:   No results found.  Latest Echo  Latest Cath   Medications:     Scheduled Medications: . collagenase   Topical Daily  . docusate sodium  100 mg Oral BID  . famotidine  20 mg Oral BID  . feeding supplement (ENSURE ENLIVE)  237 mL Oral BID BM  . folic acid  1 mg Oral Daily  . furosemide  40 mg Intravenous BID  . heparin  5,000 Units Subcutaneous 3 times per day  . insulin aspart  0-9 Units Subcutaneous TID WC  . metoprolol tartrate  25 mg Oral BID  . multivitamin with minerals  1 tablet Oral Daily  . potassium chloride  40 mEq Oral BID  . thiamine  100 mg Oral Daily     Infusions:     PRN Medications:  acetaminophen **OR** acetaminophen, diazepam, levalbuterol, LORazepam, morphine injection   Assessment/Plan   1. Acute on chronic combined CHF, Echo 12/19/14 LVEF 15-20% with severe dilation and severely reduced RV function. Now > 8 L negative.  He is still very volume overloaded.  - Will  increase IV lasix to 80 mg BID.  - Will change lopressor to coreg 3.125 BID.  However, if he is not committed into a facility, will be concerning to have him on beta blocker with cocaine abuse potential.  - Add lisinopril 2.5 mg daily  - CXR 12/22/14 shows CHF, unchanged from 12/20/14. 2. Pericardial friction rub - No pericardial abnormalities mentioned on Echo 3. 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  4. Code Status - Made DNR 12/24/14 after seeing Palliative - Family contesting and think that pt may not have capacity.  Of note, was determined to not have capacity for decision making by psych early this visit.  Seems to have cleared up now per palliative,  further assessment by psych would likely be helpful.  Difficult situation with unclear mental status and family involvement. Pt prognosis is likely very poor given substance abuse and significance of disease. Still markedly volume overloaded on exam.  Length of Stay: [address]   Graciella Freer PA-C 12/24/2014, 11:14 AM   Patient seen with PA, agree with the above note.  1. Acute on chronic systolic CHF: Biventricular failure with echo showing EF 15-20% as well as severely reduced RV systolic function and moderate-severe MR.  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  He remains markedly volume overloaded and needs ongoing diuresis.  - Increase Lasix to 80 mg IV bid - Add lisinopril 2.5 mg daily. - Continue Coreg 3.125 mg bid => can continue this if he is committed into a facility, but if goes home on his own may not be good choice with likelihood of resuming cocaine.   - Needs substance abuse cessation, think committing to facility would be best course.  2. AKI: Resolved, creatinine back to normal range.   3. Moderate to severe MR: Likely due to annular dilatation.  4. Pericardial friction rub: Noted on exam.  No pericardial effusion on echo or chest  pain.  5. Disposition: He will do poorly if he goes back to the same living situation.  Best course would be involuntary committal to facility for substance abuse treatment.  Not sure if this will be an option, psychiatry following.   Marca Ancona 12/24/2014   Advanced Heart Failure Team Pager [phone] (M-F; 7a - 4p)  Please contact [facility] for night-coverage after hours (4p -7a ) and weekends on [email or web address]

## 2014-12-24 NOTE — Progress Notes (Signed)
Difficulty throughout the shift for patients pulse oximeter to be read.  Have tried on ear, forehead and toes.  Patient stats SpO2-955 with handheld on 4L O2 via nasal cannula

## 2014-12-24 NOTE — Progress Notes (Signed)
Subjective: Patient reports no urologic complaints  Objective: Vital signs in last 24 hours: Temp:  [97.3 F (36.3 C)-97.8 F (36.6 C)] 97.8 F (36.6 C) (10/12 0400) Pulse Rate:  [48-118] 118 (10/12 0400) Resp:  [11-27] 16 (10/12 0600) BP: (103-114)/(76-93) 104/79 mmHg (10/12 0600) SpO2:  [79 %-100 %] 83 % (10/12 0400) Weight:  [116.8 kg (257 lb 8 oz)] 116.8 kg (257 lb 8 oz) (10/12 0600)  Intake/Output from previous day: 10/11 0701 - 10/12 0700 In: 610 [P.O.:360; IV Piggyback:250] Out: 2250 [Urine:2250] Intake/Output this shift:    Physical Exam:  Foley catheter is draining clear urine. Scrotum reveals edema but no worrisome erythema or crepitus.  The area on the anterior scrotum no longer has a dry eschar and appears to be slowly healing with no sign of infection.  Lab Results:  Recent Labs  12/22/14 0623 12/23/14 0316 12/24/14 0355  HGB 10.5* 10.5* 11.1*  HCT 38.9* 38.7* 41.5   BMET  Recent Labs  12/23/14 1835 12/24/14 0355  NA 141 140  K 3.6 4.3  CL 95* 92*  CO2 35* 36*  GLUCOSE 129* 119*  BUN 16 15  CREATININE 0.94 0.98  CALCIUM 8.0* 8.0*   No results for input(s): LABPT, INR in the last 72 hours. No results for input(s): LABURIN in the last 72 hours. Results for orders placed or performed during the hospital encounter of 12/11/14  Culture, blood (routine x 2)     Status: None   Collection Time: 12/11/14  7:30 PM  Result Value Ref Range Status   Specimen Description BLOOD RIGHT ANTECUBITAL  Final   Special Requests BOTTLES DRAWN AEROBIC AND ANAEROBIC EACH  Final   Culture  Setup Time   Final    GRAM POSITIVE COCCI IN CLUSTERS ANAEROBIC BOTTLE ONLY CRITICAL RESULT CALLED TO, READ BACK BY AND VERIFIED WITH: B REAP RN [id number] 12/12/14 A BROWNING    Culture   Final    STAPHYLOCOCCUS SPECIES (COAGULASE NEGATIVE) THE SIGNIFICANCE OF ISOLATING THIS ORGANISM FROM A SINGLE SET OF BLOOD CULTURES WHEN MULTIPLE SETS ARE DRAWN IS UNCERTAIN. PLEASE NOTIFY  THE MICROBIOLOGY DEPARTMENT WITHIN ONE WEEK IF SPECIATION AND SENSITIVITIES ARE REQUIRED. Performed at [facility]    Report Status 12/15/2014 FINAL  Final  Culture, blood (routine x 2)     Status: None   Collection Time: 12/11/14  8:08 PM  Result Value Ref Range Status   Specimen Description BLOOD LEFT ANTECUBITAL  Final   Special Requests BOTTLES DRAWN AEROBIC AND ANAEROBIC EACH  Final   Culture   Final    NO GROWTH 5 DAYS Performed at [facility]    Report Status 12/16/2014 FINAL  Final  MRSA PCR Screening     Status: None   Collection Time: 12/12/14  5:38 AM  Result Value Ref Range Status   MRSA by PCR NEGATIVE NEGATIVE Final    Comment:        The GeneXpert MRSA Assay (FDA approved for NASAL specimens only), is one component of a comprehensive MRSA colonization surveillance program. It is not intended to diagnose MRSA infection nor to guide or monitor treatment for MRSA infections.   Urine culture     Status: None   Collection Time: 12/12/14 12:45 PM  Result Value Ref Range Status   Specimen Description URINE, CLEAN CATCH  Final   Special Requests NONE  Final   Culture NO GROWTH 1 DAY  Final   Report Status 12/13/2014 FINAL  Final  Studies/Results: No results found.  Assessment/Plan: Scrotal edema secondary to anasarca/CHF with no sign of infection and healing anterior scrotal wall skin breakdown.  Continue current dressing changes   LOS: 12 days   Joyceann Kruser C 12/24/2014, 8:07 AM

## 2014-12-24 NOTE — Progress Notes (Signed)
Patient has refused Bipap, patient educated of the importance, especially in his case while sleeping but continues to refuse. RT will continue to monitor.

## 2014-12-24 NOTE — Progress Notes (Signed)
Daily Progress Note   Patient Name: Tom Reynolds       Date: 12/24/2014 DOB: 1965/08/16  Age: 49 y.o. MRN#: [id number] Attending Physician: Starleen Armsawood S Elgergawy, MD Primary Care Physician: No primary care provider on file. Admit Date: 12/11/2014  Reason for Consultation/Follow-up: GOC  Subjective:     I spent much time today talking with Tom Reynolds and his sister Tom Reynolds. He tells me that his heart is weak and that he is dying. He tells me that he is ready to die. Both of them were tearful. Tom Reynolds tries to be reassuring and encouraging to Tom Reynolds but he is solely focused on "going home - now - today." Unfortunately there is no home and nowhere for him to discharge too. Tom Reynolds is torn as she wants to take him but cannot be with him 24/7, provide the physical care he will need, and she does not want him to die in her home.   - DNR decided - CT head cancelled (they understand this is not going to improve his heart function and overall poor prognosis as he does not cooperate with care) - Focus on comfort although not full comfort care right now - Discussed hospice facility although he refuses anywhere except "home" (sister seems more open to hospice now)  I will have to follow up more tomorrow.    Length of Stay: 12 days  Current Medications: Scheduled Meds:  . collagenase   Topical Daily  . docusate sodium  100 mg Oral BID  . famotidine  20 mg Oral BID  . feeding supplement (ENSURE ENLIVE)  237 mL Oral BID BM  . folic acid  1 mg Oral Daily  . furosemide  40 mg Intravenous BID  . heparin  5,000 Units Subcutaneous 3 times per day  . insulin aspart  0-9 Units Subcutaneous TID WC  . metoprolol tartrate  25 mg Oral BID  . multivitamin with minerals  1 tablet Oral Daily  . potassium chloride  40 mEq Oral BID  . thiamine  100 mg Oral Daily    Continuous Infusions:    PRN Meds: acetaminophen **OR** acetaminophen, diazepam, levalbuterol, LORazepam, morphine injection  Palliative Performance  Scale: 30%     Vital Signs: BP 104/79 mmHg  Pulse 118  Temp(Src) 97.8 F (36.6 C) (Axillary)  Resp 16  Ht 5\' 7"  (1.702 m)  Wt 116.8 kg (257 lb 8 oz)  BMI 40.32 kg/m2  SpO2 83% SpO2: SpO2: (!) 83 % O2 Device: O2 Device: Nasal Cannula O2 Flow Rate: O2 Flow Rate (L/min): 4 L/min  Intake/output summary:  Intake/Output Summary (Last 24 hours) at 12/24/14 1132 Last data filed at 12/24/14 0400  Gross per 24 hour  Intake    610 ml  Output   2250 ml  Net  -1640 ml   LBM: Last BM Date: 12/21/14 Baseline Weight: Weight: 125.278 kg (276 lb 3 oz) Most recent weight: Weight: 116.8 kg (257 lb 8 oz)  Physical Exam: General: NAD HEENT: /AT CVS: Tachycardic Resp: Labored breathing periodically (refuses to use oxygen) Abd: Soft, NT, ND, obese Extrem: BLE erythema, edema 3+ Neuro: More alert, seems more oriented today to person/place/somewhat to situation   Additional Data Reviewed: Recent Labs     12/23/14  0316  12/23/14  1835  12/24/14  0355  WBC  8.8   --   9.3  HGB  10.5*   --   11.1*  PLT  236   --   328  NA  142  141  140  BUN  CREATININE  0.83  0.94  0.98     Problem List:  Patient Active Problem List   Diagnosis Date Noted  . Palliative care encounter 12/22/2014  . Euthyroid sick syndrome 12/21/2014  . Bipolar disorder (HCC) 12/19/2014  . Rash and nonspecific skin eruption 12/17/2014  . Acute encephalopathy 12/17/2014  . Sepsis (HCC) 12/12/2014  . Acute renal failure superimposed on stage 3 chronic kidney disease (HCC) 12/12/2014  . Acute on chronic systolic heart failure (HCC) 12/12/2014  . Hyponatremia 12/12/2014  . OSA (obstructive sleep apnea) 12/12/2014  . Cellulitis of leg, right 12/12/2014  . Anasarca   . Cellulitis   . Scrotal swelling   . Blood poisoning [facility])      Palliative Care Assessment & Plan    Code Status:  DNR  Goals of Care:  Focus is on comfort but difficult situation and they are unable to fully transition to  full comfort. Also unable to find disposition option.   Chaplain to see.   3. Symptom Management:  Morphine prn for pain/dyspnea.  Lorazepam prn for anxiety.   4. Palliative Prophylaxis:  Stool Softner: Senokot + colace.   5. Prognosis: Prognosis poor - days to weeks.   5. Discharge Planning: Still undetermined.    Thank you for allowing the Palliative Medicine Team to assist in the care of this patient.   Time In: 1030 Time Out: 1135 Total Time Prolonged Time Billed  yes     Greater than 50%  of this time was spent counseling and coordinating care related to the above assessment and plan.     Yong Channel, NP Palliative Medicine Team Pager # [phone] (M-F 8a-5p) Team Phone # [phone] (Nights/Weekends)  12/24/2014, 11:32 AM

## 2014-12-25 DIAGNOSIS — R4182 Altered mental status, unspecified: Secondary | ICD-10-CM | POA: Insufficient documentation

## 2014-12-25 DIAGNOSIS — R410 Disorientation, unspecified: Secondary | ICD-10-CM

## 2014-12-25 LAB — GLUCOSE, CAPILLARY
GLUCOSE-CAPILLARY: 151 mg/dL — AB (ref 65–99)
GLUCOSE-CAPILLARY: 133 mg/dL — AB (ref 65–99)
Glucose-Capillary: 152 mg/dL — ABNORMAL HIGH (ref 65–99)
Glucose-Capillary: 169 mg/dL — ABNORMAL HIGH (ref 65–99)

## 2014-12-25 LAB — CBC
HCT: 39.2 % (ref 39.0–52.0)
Hemoglobin: 10.4 g/dL — ABNORMAL LOW (ref 13.0–17.0)
MCH: 18.7 pg — ABNORMAL LOW (ref 26.0–34.0)
MCHC: 26.5 g/dL — ABNORMAL LOW (ref 30.0–36.0)
MCV: 70.5 fL — AB (ref 78.0–100.0)
PLATELETS: 314 10*3/uL (ref 150–400)
RBC: 5.56 MIL/uL (ref 4.22–5.81)
RDW: 23.2 % — AB (ref 11.5–15.5)
WBC: 7.6 10*3/uL (ref 4.0–10.5)

## 2014-12-25 LAB — BASIC METABOLIC PANEL
ANION GAP: 11 (ref 5–15)
BUN: 15 mg/dL (ref 6–20)
CALCIUM: 7.9 mg/dL — AB (ref 8.9–10.3)
CO2: 35 mmol/L — ABNORMAL HIGH (ref 22–32)
Chloride: 92 mmol/L — ABNORMAL LOW (ref 101–111)
Creatinine, Ser: 0.91 mg/dL (ref 0.61–1.24)
GLUCOSE: 147 mg/dL — AB (ref 65–99)
POTASSIUM: 4.2 mmol/L (ref 3.5–5.1)
SODIUM: 138 mmol/L (ref 135–145)

## 2014-12-25 LAB — AMMONIA: AMMONIA: 36 umol/L — AB (ref 9–35)

## 2014-12-25 LAB — MAGNESIUM: Magnesium: 1.7 mg/dL (ref 1.7–2.4)

## 2014-12-25 MED ORDER — MAGNESIUM SULFATE 2 GM/50ML IV SOLN
2.0000 g | Freq: Once | INTRAVENOUS | Status: AC
  Administered 2014-12-25: 2 g via INTRAVENOUS
  Filled 2014-12-25: qty 50

## 2014-12-25 MED ORDER — FUROSEMIDE 10 MG/ML IJ SOLN
80.0000 mg | Freq: Three times a day (TID) | INTRAMUSCULAR | Status: DC
  Administered 2014-12-25 – 2014-12-28 (×6): 80 mg via INTRAVENOUS
  Filled 2014-12-25 (×10): qty 8

## 2014-12-25 MED ORDER — DIPHENHYDRAMINE HCL 25 MG PO CAPS: 25.0000 mg | ORAL_CAPSULE | Freq: Once | ORAL | Status: DC

## 2014-12-25 MED ORDER — LISINOPRIL 2.5 MG PO TABS
2.5000 mg | ORAL_TABLET | Freq: Two times a day (BID) | ORAL | Status: DC
  Administered 2014-12-25 – 2014-12-28 (×6): 2.5 mg via ORAL
  Filled 2014-12-25 (×8): qty 1

## 2014-12-25 MED ORDER — SPIRONOLACTONE 25 MG PO TABS
12.5000 mg | ORAL_TABLET | Freq: Every day | ORAL | Status: DC
  Administered 2014-12-26 – 2014-12-28 (×3): 12.5 mg via ORAL
  Filled 2014-12-25 (×4): qty 1

## 2014-12-25 NOTE — Progress Notes (Signed)
Advanced Heart Failure Rounding Note  Referring Physician: Dr. 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 Refuses to answer questions. Wants to go home.  Out 1.1 L but weight relatively stable on 80 mg IV lasix BID.   Objective:   Weight Range: 259 lb 4.2 oz (117.6 kg) Body mass index is 40.6 kg/(m^2).   Vital Signs:   Temp:  [97.5 F (36.4 C)-98.3 F (36.8 C)] 97.7 F (36.5 C) (10/13 0832) Pulse Rate:  [103-118] 104 (10/13 0832) Resp:  [14-35] 22 (10/13 0832) BP: (105-129)/(67-99) 107/71 mmHg (10/13 0832) SpO2:  [72 %-100 %] 94 % (10/13 0832) Weight:  [259 lb 4.2 oz (117.6 kg)] 259 lb 4.2 oz (117.6 kg) (10/13 0500) Last BM Date: 12/21/14  Weight change: Filed Weights   12/23/14 0500 12/24/14 0600 12/25/14 0500  Weight: 266 lb 12.1 oz (121 kg) 257 lb 8 oz (116.8 kg) 259 lb 4.2 oz (117.6 kg)    Intake/Output:   Intake/Output Summary (Last 24 hours) at 12/25/14 0843 Last data filed at 12/25/14 0500  Gross per 24 hour  Intake    180 ml  Output   1325 ml  Net  -1145 ml     Physical Exam: General:  Chronically ill appearing. Soft restrains in place. HEENT: normal Neck: supple. JVP difficult to assess with pt size, but appears elevated. Carotids 2+ bilat; no bruits. No lymphadenopathy or thyromegaly. Cor: PMI nondisplaced. RRR. No rubs, gallops. 2/6 MR murmur, pericardial friction rub.  Lungs: Clear anterior and lateral. Abdomen: Obese, soft, NT, mild distention. No HSM. No bruits or masses. +BS Extremities: Multiple wounds and cellulitic changes. 2-3+ LE edema. Neuro: Alert and oriented x 3 (person, place, and disease state), cranial nerves grossly intact. moves all 4 extremities w/o difficulty. Affect flat, agitated  Telemetry: Sinus Tach 100s  Labs: CBC  Recent Labs  12/24/14 0355 12/25/14 0331  WBC 9.3 7.6  HGB 11.1* 10.4*  HCT 41.5  39.2  MCV 70.7* 70.5*  PLT 328 314   Basic Metabolic Panel  Recent Labs  12/24/14 0355 12/25/14 0331  NA 140 138  K 4.3 4.2  CL 92* 92*  CO2 36* 35*  GLUCOSE 119* 147*  BUN 15 15  CREATININE 0.98 0.91  CALCIUM 8.0* 7.9*  MG 1.9 1.7   Liver Function Tests No results for input(s): AST, ALT, ALKPHOS, BILITOT, PROT, ALBUMIN in the last 72 hours. No results for input(s): LIPASE, AMYLASE in the last 72 hours. Cardiac Enzymes No results for input(s): CKTOTAL, CKMB, CKMBINDEX, TROPONINI in the last 72 hours.  BNP: BNP (last 3 results)  Recent Labs  12/21/14 1055 12/22/14 0623 12/24/14 0355  BNP 1741.5* 1896.0* 1749.0*    ProBNP (last 3 results) No results for input(s): PROBNP in the last 8760 hours.   D-Dimer No results for input(s): DDIMER in the last 72 hours. Hemoglobin A1C No results for input(s): HGBA1C in the last 72 hours. Fasting Lipid Panel No results for input(s): CHOL, HDL, LDLCALC, TRIG, CHOLHDL, LDLDIRECT in the last 72 hours. Thyroid Function Tests No results for input(s): TSH, T4TOTAL, T3FREE, THYROIDAB in the last 72 hours.  Invalid input(s): FREET3  Other results:     Imaging/Studies:  No results found.  Latest Echo  Latest Cath   Medications:     Scheduled Medications: . carvedilol  3.125 mg Oral BID WC  . collagenase   Topical Daily  . docusate  sodium  100 mg Oral BID  . famotidine  20 mg Oral BID  . feeding supplement (ENSURE ENLIVE)  237 mL Oral BID BM  . folic acid  1 mg Oral Daily  . furosemide  80 mg Intravenous BID  . heparin  5,000 Units Subcutaneous 3 times per day  . insulin aspart  0-9 Units Subcutaneous TID WC  . lisinopril  2.5 mg Oral Daily  . multivitamin with minerals  1 tablet Oral Daily  . potassium chloride  40 mEq Oral BID  . senna  1 tablet Oral QHS  . thiamine  100 mg Oral Daily    Infusions:    PRN Medications: acetaminophen **OR** acetaminophen, diazepam, levalbuterol, LORazepam, morphine  injection   Assessment/Plan   1. Acute on chronic combined CHF: Echo 12/19/14 LVEF 15-20% with severe dilation and severely reduced RV function. Now > 8 L negative.  - Remains with significant volume overload. Increase IV lasix to 80 mg TID. Cr stable. - Continue coreg 3.125 BID.  If he is not committed into a facility, will be concerning to have him on beta blocker with cocaine abuse potential.  - Increase lisinopril to 2.5 mg bid, add spironolactone 12.5 daily.  - CXR 12/22/14 shows CHF, unchanged from 12/20/14. 2. Pericardial friction rub - No pericardial abnormalities noted on Echo 3. 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  4. Code Status - Made DNR 12/24/14 after seeing Palliative - Sister open to hospice. Pt refusing anywhere but home, but he is currently homeless.  Poor long term prognosis. Will increase diuresis to get him as euvolemic as possible, may be difficult with severe RV failure.  Length of Stay: [address]   Graciella FreerMichael Andrew Tillery PA-C 12/25/2014, 8:43 AM    Patient see with PA, agree with the above note.  He has been involuntarily committed.   He is restrained currently and not very interactive.  Vitals stable. Diuresing well with IV Lasix.  - Increase Lasix to 80 mg IV every 8 hrs, still has considerable volume overload.  - Increase lisinopril to 2.5 mg bid.  - Add spironolactone 12.5 mg daily.  - Follow BMET, so far stable.  - Still has prominent pericardial friction rub but no chest pain or pericardial abnormalities on echo.   Marca AnconaDalton Saed Hudlow 12/25/2014 1:40 PM

## 2014-12-25 NOTE — Progress Notes (Signed)
   12/25/14 1600  Clinical Encounter Type  Visited With Patient and family together  Visit Type Spiritual support  Referral From Family  Patient's sister requested chaplain's visit. Patient asked chaplain to leave as soon as she identified herself. Chaplain indicated she would return if requested and may make additional effort to see sister next week. Explained situation to nurse and offered to return whenever needed.

## 2014-12-25 NOTE — Progress Notes (Signed)
Patient became very agitated and upset when family present in room. Stating he was going to "leave" and for Tom Reynolds to "let him out of here". Patient refused all evening medications and CBG check. When attempted he began to yell shake his head no and bang his fists on the bedrails. Sister attempted to help explain to patient importance of meds but patient still refused. RN will continue to monitor patient. C.Emmajean Ratledge,RN

## 2014-12-25 NOTE — Progress Notes (Signed)
[facility] PROGRESS NOTE  Tom Reynolds WUJ:[id number] DOB: Apr 01, 1965 DOA: 12/11/2014 PCP: No primary care provider on file.  Brief interval history HPI:  49 y.o. male accompanied by family, with a history of substance abuse, DM, HTN, alcoholic cardiomyopathy/chronic systolic heart failure with an EF of 20-25% and chronic kidney disease stage II with baseline creatinine of 1.2, who presented at [facility] with worsening shortness of breath over the last 2 weeks and worsening bilateral lower extremity edema associated with subjective fever, productive cough, genital pain and swelling of the scrotum. Patient also complained of chest pain that started 2 days ago, described as pleuritic in nature. Patient presented with similar complaints with worsening swelling of the scrotum and redness with difficulty urinating to [facility] on 9/27. On 9/27 patient was found by EMS naked in the local area and he was brought in but left AMA because of his court date. 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 Was found to have an elevated lactate, [facility]. CT scan done prior to transfer does not show evidence of Fournier's gangrene. Patient admitted to step down. Blood pressure soft 99 /63  Patient was admitted and placed in the step down unit. Patient was placed empirically on IV vancomycin IV Azactam for his lower extremity cellulitis and probable scrotal swelling and cellulitis. Patient was seen in consultation by urology who felt no was no abscess and felt  etiology of patient's scrotal swelling and cellulitis was secondary to poor hygiene and recommended continuation of IV antibiotics. Patient was also noted to be in acute renal failure and placed on IV fluids with some improvement in his renal function. Patient also noted to have anasarca from his lower extremities all the way up to his nipples. During the hospitalization patient was agitated belligerent and fighting with the staff and had to be placed in restraints and placed on IV Ativan and IV dilaudid. IV Dilaudid was discontinued. Patient was switched from the Ativan withdrawal protocol to the Valium withdrawal protocol. Psych was consulted to help with patient's agitation as well as capacity and patient did not have capacity. Patient was noted to have anasarca and be in acute on chronic systolic heart failure. 2-D echo which was done had a EF of 15-20%. Patient was diuresed with IV Lasix with good diuresis and cardiology was consulted and currently following. Patient is deemed to have no capacity and a such can be IVC for treatment. Patient will likely need skilled nursing facility on discharge. Patient's sister Clydie BraunKaren is a healthcare power of attorney.     Assessment/Plan:  history of alcoholic cardiomyopathy/acute on chronic systolic heart failure with  - EF of 15-20%per 2 d echo 12/19/2014 - With history of noncompliance, [facility], not taking any of his home medication. - Initially requiring BiPAP, currently off BiPAP after aggressive diuresis. - CHF cardiology consult appreciated. - [facility] started on lisinopril, Coreg, diuresis was increased to 80 mg IV twice a day, strict ins and outs, daily weights. - Long-term prognosis is very poor per cardiology  anasarca Questionable etiology. Likely secondary  to cardiomyopathy/acute on chronic CHF exacerbation. Improving with diuresis  sepsis - sepsis felt to be secondary to bilateral lower extremity  cellulitis, scrotal cellulitis. had elevated lactic acid level of 3.83 . - Urine cultures were negative. Blood cultures with 1/2 coag neg. staph likely a contaminant. - Currently off IV antibiotics .   scrotal swelling and cellulitis/ LE cellulitis - afebrile. WBC has trended down. Patient still with significant scrotal swelling. CT pelvis was negative for soft tissue gas or abscess.  - Patient was initially on IV vancomycin and IV Azactam, transitioned to clindamycin , back on Azactam due to rash , currently off antibiotics . - Continue with wound care  - Urology consult appreciated .   acute encephalopathy - multifactorial secondary to alcohol withdrawal , metabolic related to infection initially.   - acute renal failure chronic kidney disease stage II - Baseline renal function is 1.26. On admission patient was up to 2.32.  - Resolved, back to baseline   hyponatremia - Resolved.   ETOH abuse Patient likely going through withdrawal with agitation. Per nursing improvement with agitation with change from ativan to valium on withdrawal protocol. Thiamine. Folic acid. Follow.   bipolar disorder Patient with a history of bipolar disorder per patient and verified on care everywhere from her PCPs note. Patient states was on Zoloft 200 mg daily. Patient with improvement in agitation and aggressive behavior. Patient has been seen in consultation by psychiatry who recommended continuing safety sitter. Haldol has been discontinued  at family's insistence. Ativan 1 mg every 6 hours as needed for restlessness and agitation. Was recommended to continue Valium 5 mg IV as needed for uncontrollable anxiety/panic attacks. Appreciate psychiatric input.    gastroesophageal reflux disease - Pepcid.   euthyroid sick syndrome - TSH is elevated however free T4 within normal limits. Will need repeat thyroid function studies in about 4-6 weeks post discharge.   hypokalemia/hypomagnesemia Hypokalemia likely  secondary to diureses. Repleted. Keep magnesium greater than 2.   prophylaxis Pepcid for GI prophylaxis. Heparin for DVT prophylaxis.   prognosis - Overall poor prognosis, palliative care following, currently patient is DO NOT RESUSCITATE.   Code StatuS: DO NOT RESUSCITATE  Family Communication: No family at bedside.   Disposition Plan: Remain the step down unit   Consultants:  Urology: Dr. Neva Seat 12/12/2014  Psychiatry: Dr. Elsie Saas 12/19/2014  Cardiology: Dr. Wyline Mood 12/20/2014  Palliative Care : Dr Linna Darner 12/22/2014  Procedures:  CT pelvis 12/12/2014  Chest x-ray 12/11/2014  2-D echo 12/19/2014  Antibiotics:  IV ciprofloxacin 12/11/2014>>> 9/29/ 2016  IV clindamycin 12/11/2014>>> 12/11/2014  IV clindamycin 12/16/2014>>>> 12/17/2014  IV Azactam 12/12/2014>>> 12/15/2014  IV Azactam 12/17/2014>>>> 12/23/2014  IV vancomycin 12/11/2014>>>> 12/15/2014  HPI/Subjective: Patient off BiPAP overnight. Patient alert oriented to self, place . Patient denies any complaints.  Objective: Filed Vitals:   12/25/14 1200  BP: 123/100  Pulse: 109  Temp:   Resp: 0    Intake/Output Summary (Last 24 hours) at 12/25/14 1223 Last data filed at 12/25/14 1209  Gross per 24 hour  Intake    120 ml  Output   2825 ml  Net  -2705 ml   Filed Weights   12/23/14 0500 12/24/14 0600 12/25/14 0500  Weight: 121 kg (266 lb 12.1 oz) 116.8 kg (257 lb 8 oz) 117.6 kg (259 lb 4.2 oz)    Exam:   General:  Off BIPAP  Cardiovascular: Tachycardia with 3/6 SEM  Respiratory: Decreased BS in bases. Scattered crackles. No wheezing.  Abdomen: Soft, slight  distention, positive bowel sounds, no rebound, no guarding. Rash noted on the back in the lower torso improved. scrotal wound with eschar. Scrotal swelling            Musculoskeletal: No clubbing or cyanosis. 2+ bilateral lower extremity edema up to mid abdomen improving.  Data Reviewed: Basic Metabolic Panel:  Recent Labs Lab  12/19/14 0328 12/20/14 0308  12/22/14 4098 12/23/14 0316 12/23/14 1835 12/24/14 0355 12/25/14 0331  NA 138 139  < > 141 142 141 140 138  K 4.9 4.8  < > 4.2 2.5* 3.6 4.3 4.2  CL 99* 103  < > 97* 95* 95* 92* 92*  CO2 25 25  < > 33* 38* 35* 36* 35*  GLUCOSE 141* 141*  < > 142* 125* 129* 119* 147*  BUN 26* 24*  < > 21* CREATININE 1.18 1.07  < > 1.09 0.83 0.94 0.98 0.91  CALCIUM 8.8* 8.4*  < > 8.3* 8.1* 8.0* 8.0* 7.9*  MG 2.1 2.0  --   --  1.3*  --  1.9 1.7  < > = values in this interval not displayed. Liver Function Tests:  Recent Labs Lab 12/19/14 0328  AST 39  ALT 13*  ALKPHOS 200*  BILITOT 2.3*  PROT 6.2*  ALBUMIN 1.8*   No results for input(s): LIPASE, AMYLASE in the last 168 hours.  Recent Labs Lab 12/25/14 0331  AMMONIA 36*   CBC:  Recent Labs Lab 12/21/14 1055 12/22/14 0623 12/23/14 0316 12/24/14 0355 12/25/14 0331  WBC 12.2* 10.1 8.8 9.3 7.6  HGB 11.0* 10.5* 10.5* 11.1* 10.4*  HCT 40.3 38.9* 38.7* 41.5 39.2  MCV 69.7* 69.5* 69.9* 70.7* 70.5*  PLT 292 281 236 328 314   Cardiac Enzymes:  Recent Labs Lab 12/20/14 0903 12/20/14 1650 12/20/14 1859  TROPONINI 0.03 0.03 0.04*   BNP (last 3 results)  Recent Labs  12/21/14 1055 12/22/14 0623 12/24/14 0355  BNP 1741.5* 1896.0* 1749.0*    ProBNP (last 3 results) No results for input(s): PROBNP in the last 8760 hours.  CBG:  Recent Labs Lab 12/24/14 0737 12/24/14 1137 12/24/14 1602 12/24/14 2114 12/25/14 0829  GLUCAP 129* 146* 184* 155* 151*    No results found for this or any previous visit (from the past 240 hour(s)).   Studies: No results found.  Scheduled Meds: . carvedilol  3.125 mg Oral BID WC  . collagenase   Topical Daily  . docusate sodium  100 mg Oral BID  . famotidine  20 mg Oral BID  . feeding supplement (ENSURE ENLIVE)  237 mL Oral BID BM  . folic acid  1 mg Oral Daily  . furosemide  80 mg Intravenous BID  . heparin  5,000 Units Subcutaneous 3 times  per day  . insulin aspart  0-9 Units Subcutaneous TID WC  . lisinopril  2.5 mg Oral Daily  . multivitamin with minerals  1 tablet Oral Daily  . potassium chloride  40 mEq Oral BID  . senna  1 tablet Oral QHS  . thiamine  100 mg Oral Daily   Continuous Infusions:    Principal Problem:   Acute on chronic systolic heart failure (HCC) Active Problems:   Sepsis (HCC)   Acute renal failure superimposed on stage 3 chronic kidney disease (HCC)   Hyponatremia   OSA (obstructive sleep apnea)   Cellulitis of leg, right   Anasarca   Cellulitis   Scrotal swelling   Blood poisoning (HCC)  Rash and nonspecific skin eruption   Acute encephalopathy   Bipolar disorder (HCC)   Euthyroid sick syndrome   Palliative care encounter    Time spent: 40 mins    [facility], DAWOOD MD [facility] Pager [phone]. If 7PM-7AM, please contact night-coverage at [email or web address], password [facility] 12/25/2014, 12:23 PM  LOS: 13 days

## 2014-12-25 NOTE — Progress Notes (Signed)
CSW will continue to follow for any potential placement needs (Hospice vs SNF vs home)  Merlyn LotJenna Holoman, [facility] Spring[facility]WA Clinical Social Worker [phone]

## 2014-12-26 LAB — GLUCOSE, CAPILLARY
GLUCOSE-CAPILLARY: 142 mg/dL — AB (ref 65–99)
Glucose-Capillary: 118 mg/dL — ABNORMAL HIGH (ref 65–99)
Glucose-Capillary: 160 mg/dL — ABNORMAL HIGH (ref 65–99)
Glucose-Capillary: 164 mg/dL — ABNORMAL HIGH (ref 65–99)

## 2014-12-26 LAB — BASIC METABOLIC PANEL
Anion gap: 13 (ref 5–15)
BUN: 14 mg/dL (ref 6–20)
CO2: 38 mmol/L — AB (ref 22–32)
Calcium: 7.9 mg/dL — ABNORMAL LOW (ref 8.9–10.3)
Chloride: 87 mmol/L — ABNORMAL LOW (ref 101–111)
Creatinine, Ser: 0.86 mg/dL (ref 0.61–1.24)
GFR calc Af Amer: >60 mL/min (ref >60)
GFR calc non Af Amer: >60 mL/min (ref >60)
GLUCOSE: 128 mg/dL — AB (ref 65–99)
Potassium: 3.5 mmol/L (ref 3.5–5.1)
Sodium: 138 mmol/L (ref 135–145)

## 2014-12-26 MED ORDER — SENNOSIDES-DOCUSATE SODIUM 8.6-50 MG PO TABS
1.0000 | ORAL_TABLET | Freq: Every day | ORAL | Status: DC
  Administered 2014-12-26 – 2014-12-28 (×3): 1 via ORAL
  Filled 2014-12-26 (×3): qty 1

## 2014-12-26 NOTE — Progress Notes (Signed)
Physical Therapy Treatment Patient Details Name: Tom RohrerStephen Reynolds MRN: [id number] DOB: [birth year] Today's Date: 12/26/2014    History of Present Illness 49 y.o. with a history of substance abuse, DM, HTN, alcoholic cardiomyopathy/chronic systolic heart failure with an EF of 20-25% and chronic kidney disease stage II, admitte4d with worsening shortness of breath over the last 2 weeks and worsening bilateral lower extremity edema associated with subjective fever, productive cough, genital pain and swelling of the scrotum as well as encephalopathy    PT Comments    Patient seen for therapies, reluctant to participate, non compliant with supplemental oxygen and monitors. Patient tolerated EOB sitting for approximately 5 minutes then impulsively returning to bed. Patient did required pericare and hygiene secondary to incontinence. Patient with increased noted fatigue for limited activity. Assisted back to bed with nsg/sitter present.  Follow Up Recommendations  SNF;Supervision/Assistance - 24 hour     Equipment Recommendations  None recommended by PT    Recommendations for Other Services       Precautions / Restrictions Precautions Precautions: Fall Precaution Comments: scrotal edema Restrictions Weight Bearing Restrictions: No    Mobility  Bed Mobility Overal bed mobility: Needs Assistance Bed Mobility: Supine to Sit;Sit to Supine     Supine to sit: Min assist;HOB elevated Sit to supine: Mod assist   General bed mobility comments: Patient impulsive with movements, able to come to EOB with support and assist for trunk elevation, increased assist to elevate LEs to bed upon return to supine  Transfers                    Ambulation/Gait                 Stairs            Wheelchair Mobility    Modified Rankin (Stroke Patients Only)       Balance Overall balance assessment: Needs assistance Sitting-balance support: Bilateral upper extremity  supported Sitting balance-Leahy Scale: Fair                              Cognition Arousal/Alertness: Awake/alert Behavior During Therapy: Agitated;Impulsive;Restless Overall Cognitive Status: No family/caregiver present to determine baseline cognitive functioning Area of Impairment: Orientation;Safety/judgement Orientation Level: Disoriented to;Place       Safety/Judgement: Decreased awareness of safety;Decreased awareness of deficits          Exercises      General Comments General comments (skin integrity, edema, etc.): PROM bilateral UEs and LEs      Pertinent Vitals/Pain Pain Assessment: No/denies pain    Home Living                      Prior Function            PT Goals (current goals can now be found in the care plan section) Acute Rehab PT Goals PT Goal Formulation: With patient Time For Goal Achievement: 01/05/15 Potential to Achieve Goals: Fair Progress towards PT goals: Not progressing toward goals - comment (patient reluctant to participate)    Frequency  Min 2X/week    PT Plan Frequency needs to be updated    Co-evaluation             End of Session Equipment Utilized During Treatment: Gait belt Activity Tolerance: Patient tolerated treatment well Patient left: in bed;with call bell/phone within reach;with nursing/sitter in room;with bed alarm set;with restraints reapplied (bil wrists)  Time: 1610-9604 PT Time Calculation (min) (ACUTE ONLY): 22 min  Charges:  $Therapeutic Activity: 8-22 mins                    G Codes:      Fabio Asa 01/22/15, 1:40 PM Charlotte Crumb, PT DPT  [phone]

## 2014-12-26 NOTE — Progress Notes (Signed)
Daily Progress Note   Patient Name: Tom RohrerStephen XXXTeal       Date: 12/26/2014 DOB: 03-31-1965  Age: 49 y.o. MRN#: [id number] Attending Physician: Starleen Armsawood S Elgergawy, MD Primary Care Physician: No primary care provider on file. Admit Date: 12/11/2014  Reason for Consultation/Follow-up: Establishing goals of care  Subjective:     Jeannett Reynolds is more awake and alert today. He is able to tell me that [facility] and that it is 2016 (although he says the month is "3"). He makes a few jokes. I told him that he has 2 paths to choose from: 1) to work and cooperate with his care by taking medications and participating in rehab to try and get stronger or 2) hospice care. He says he wants hospice. I asked him why he wants hospice and he tells me because he wants to die peacefully.   I updated his sister Clydie BraunKaren about this conversation. Clydie BraunKaren believes that he wants hospice because she says a RN told him hospice cannot restrain him. Actually he cannot be restrained anywhere outside the hospital. Clydie BraunKaren is still unable to give the support for comfort and hospice care. She is appreciative of the update and support. Very difficult situation. Please call for acute palliative needs over the weekend #[phone].    Length of Stay: 14 days  Current Medications: Scheduled Meds:  . carvedilol  3.125 mg Oral BID WC  . collagenase   Topical Daily  . famotidine  20 mg Oral BID  . feeding supplement (ENSURE ENLIVE)  237 mL Oral BID BM  . folic acid  1 mg Oral Daily  . furosemide  80 mg Intravenous TID  . heparin  5,000 Units Subcutaneous 3 times per day  . insulin aspart  0-9 Units Subcutaneous TID WC  . lisinopril  2.5 mg Oral BID  . multivitamin with minerals  1 tablet Oral Daily  . potassium chloride  40 mEq Oral BID  . senna-docusate  1 tablet Oral QHS  . spironolactone  12.5 mg Oral Daily  . thiamine  100 mg Oral Daily    Continuous Infusions:    PRN Meds: acetaminophen **OR**  acetaminophen, diazepam, levalbuterol, LORazepam, morphine injection  Palliative Performance Scale: 30%     Vital Signs: BP 102/85 mmHg  Pulse 96  Temp(Src) 97.9 F (36.6 C) (Axillary)  Resp 11  Ht 5\' 7"  (1.702 m)  Wt 113.6 kg (250 lb 7.1 oz)  BMI 39.22 kg/m2  SpO2 100% SpO2: SpO2: 100 % O2 Device: O2 Device: Nasal Cannula O2 Flow Rate: O2 Flow Rate (L/min): 5.5 L/min  Intake/output summary:  Intake/Output Summary (Last 24 hours) at 12/26/14 1148 Last data filed at 12/26/14 1100  Gross per 24 hour  Intake    360 ml  Output   3175 ml  Net  -2815 ml   LBM: Last BM Date: 12/25/14 Baseline Weight: Weight: 125.278 kg (276 lb 3 oz) Most recent weight: Weight: 113.6 kg (250 lb 7.1 oz)  Physical Exam: General: NAD HEENT: /AT CVS: Tachycardic Resp: No labored breathing Abd: Soft, NT, ND, obese Extrem: BLE erythema, edema 3+ Neuro: More awake, alert, oriented today   Additional Data Reviewed: Recent Labs     12/24/14  0355  12/25/14  0331  12/26/14  0249  WBC  9.3  7.6   --   HGB  11.1*  10.4*   --   PLT  328  314   --   NA  140  138  138  BUN  CREATININE  0.98  0.91  0.86     Problem List:  Patient Active Problem List   Diagnosis Date Noted  . Altered mental state   . Palliative care encounter 12/22/2014  . Euthyroid sick syndrome 12/21/2014  . Bipolar disorder (HCC) 12/19/2014  . Rash and nonspecific skin eruption 12/17/2014  . Acute encephalopathy 12/17/2014  . Sepsis (HCC) 12/12/2014  . Acute renal failure superimposed on stage 3 chronic kidney disease (HCC) 12/12/2014  . Acute on chronic systolic heart failure (HCC) 12/12/2014  . Hyponatremia 12/12/2014  . OSA (obstructive sleep apnea) 12/12/2014  . Cellulitis of leg, right 12/12/2014  . Anasarca   . Cellulitis   . Scrotal swelling   . Blood poisoning [facility])      Palliative Care Assessment & Plan    Code Status:  DNR  Goals of Care:  Focus is on comfort but difficult  situation and they are unable to fully transition to full comfort. Also unable to plan for disposition options even though he continues to want to leave the hospital.   3. Symptom Management:  Morphine prn for pain/dyspnea.  Lorazepam prn for anxiety.  4. Palliative Prophylaxis:  Stool Softner: Senokot-S qhs.  5. Prognosis: Prognosis poor - likely weeks or less given his noncompliance. I have shared with sister Clydie Braun.   5. Discharge Planning: Still undetermined. I do recommend hospice facility but pt/family not yet on board with this.    Thank you for allowing the Palliative Medicine Team to assist in the care of this patient.   Time In: 1125 Time Out: 1205 Total Time Prolonged Time Billed  no     Greater than 50%  of this time was spent counseling and coordinating care related to the above assessment and plan.   Yong Channel, NP Palliative Medicine Team Pager # [phone] (M-F 8a-5p) Team Phone # [phone] (Nights/Weekends)  12/26/2014, 11:48 AM

## 2014-12-26 NOTE — Progress Notes (Signed)
Patient ID: Tom Reynolds, male   DOB: 1965-08-29, 49 y.o.   MRN: [id number]  Advanced Heart Failure Rounding Note  Referring Physician: Dr. Randol KernElgergawy PCP: None on file Primary Cardiologist: None  Reason for consultation: Alcoholic cardiomyopathy with LVEF 15-20%  Subjective:    Say he feels worse.  Will not answer other questions, wants to watch football game.  Sitter and restraints in place.  Refusing some po meds yesterday.   Objective:   Weight Range: 250 lb 7.1 oz (113.6 kg) Body mass index is 39.22 kg/(m^2).   Vital Signs:   Temp:  [97.6 F (36.4 C)-97.9 F (36.6 C)] 97.9 F (36.6 C) (10/14 0756) Pulse Rate:  [92-113] 96 (10/14 0756) Resp:  [0-27] 11 (10/14 0756) BP: (95-135)/(49-112) 102/85 mmHg (10/14 0756) SpO2:  [85 %-100 %] 100 % (10/14 0756) Weight:  [250 lb 7.1 oz (113.6 kg)] 250 lb 7.1 oz (113.6 kg) (10/14 0500) Last BM Date: 12/25/14  Weight change: Filed Weights   12/24/14 0600 12/25/14 0500 12/26/14 0500  Weight: 257 lb 8 oz (116.8 kg) 259 lb 4.2 oz (117.6 kg) 250 lb 7.1 oz (113.6 kg)    Intake/Output:   Intake/Output Summary (Last 24 hours) at 12/26/14 0837 Last data filed at 12/26/14 0811  Gross per 24 hour  Intake    120 ml  Output   3875 ml  Net  -3755 ml     Physical Exam: General:  Chronically ill appearing. Soft restrains in place. HEENT: normal Neck: supple. JVP 12 cm. Carotids 2+ bilat; no bruits. No lymphadenopathy or thyromegaly. Cor: PMI nondisplaced. RRR. No rubs, gallops. Pericardial friction rub.  Lungs: Clear anterior and lateral. Abdomen: Obese, soft, NT, mild distention. No HSM. No bruits or masses. +BS Extremities: Multiple wounds and cellulitic changes. 1+ edema to knees bilaterally. Neuro: Alert and oriented x 3 (person, place, and disease state), cranial nerves grossly intact. moves all 4 extremities w/o difficulty. Affect flat, agitated  Telemetry: Sinus Tach 100s  Labs: CBC  Recent Labs  12/24/14 0355  12/25/14 0331  WBC 9.3 7.6  HGB 11.1* 10.4*  HCT 41.5 39.2  MCV 70.7* 70.5*  PLT 328 314   Basic Metabolic Panel  Recent Labs  12/24/14 0355 12/25/14 0331 12/26/14 0249  NA 140 138 138  K 4.3 4.2 3.5  CL 92* 92* 87*  CO2 36* 35* 38*  GLUCOSE 119* 147* 128*  BUN 15 15 14   CREATININE 0.98 0.91 0.86  CALCIUM 8.0* 7.9* 7.9*  MG 1.9 1.7  --    Liver Function Tests No results for input(s): AST, ALT, ALKPHOS, BILITOT, PROT, ALBUMIN in the last 72 hours. No results for input(s): LIPASE, AMYLASE in the last 72 hours. Cardiac Enzymes No results for input(s): CKTOTAL, CKMB, CKMBINDEX, TROPONINI in the last 72 hours.  BNP: BNP (last 3 results)  Recent Labs  12/21/14 1055 12/22/14 0623 12/24/14 0355  BNP 1741.5* 1896.0* 1749.0*    ProBNP (last 3 results) No results for input(s): PROBNP in the last 8760 hours.   D-Dimer No results for input(s): DDIMER in the last 72 hours. Hemoglobin A1C No results for input(s): HGBA1C in the last 72 hours. Fasting Lipid Panel No results for input(s): CHOL, HDL, LDLCALC, TRIG, CHOLHDL, LDLDIRECT in the last 72 hours. Thyroid Function Tests No results for input(s): TSH, T4TOTAL, T3FREE, THYROIDAB in the last 72 hours.  Invalid input(s): FREET3  Other results:     Imaging/Studies:  No results found.  Latest Echo  Latest Cath  Medications:     Scheduled Medications: . carvedilol  3.125 mg Oral BID WC  . collagenase   Topical Daily  . diphenhydrAMINE  25 mg Oral Once  . docusate sodium  100 mg Oral BID  . famotidine  20 mg Oral BID  . feeding supplement (ENSURE ENLIVE)  237 mL Oral BID BM  . folic acid  1 mg Oral Daily  . furosemide  80 mg Intravenous TID  . heparin  5,000 Units Subcutaneous 3 times per day  . insulin aspart  0-9 Units Subcutaneous TID WC  . lisinopril  2.5 mg Oral BID  . multivitamin with minerals  1 tablet Oral Daily  . potassium chloride  40 mEq Oral BID  . senna  1 tablet Oral QHS  .  spironolactone  12.5 mg Oral Daily  . thiamine  100 mg Oral Daily    Infusions:    PRN Medications: acetaminophen **OR** acetaminophen, diazepam, levalbuterol, LORazepam, morphine injection   Assessment/Plan   1. Acute on chronic systolic CHF: Echo 12/19/14 LVEF 15-20% with severe dilation and severely reduced RV function. Suspect this is an ETOH cardiomyopathy.  Remains significantly volume overload.  - Continue Lasix 80 mg IV every 8 hrs.  Replace K => give IV if refuses po.  - Continue coreg 3.125 BID.  If he is not committed into a facility, will be concerning to have him on beta blocker with cocaine abuse potential.  - Continue lisinopril and spironolactone at current doses, he is sometimes refusing the po meds so will not change doses.   2. Pericardial friction rub: No pericardial abnormalities noted on echo, no chest pain.  3. Moderate to severe MR by echo: Likely functional MR from annular dilatation.  4. Code Status: Made DNR 12/24/14 after seeing palliative care.  He has been involuntarily committed.   Poor long term prognosis given lack of insight. Will continue diuresis to get him as euvolemic as possible.  Length of Stay: 14  Marca Ancona 12/26/2014 8:37 AM

## 2014-12-26 NOTE — Progress Notes (Signed)
Nutrition Follow-up  DOCUMENTATION CODES:   Morbid obesity  INTERVENTION:   Continue Ensure Enlive po BID, each supplement provides 350 kcal and 20 grams of protein   Continue MVI with minerals daily  NUTRITION DIAGNOSIS:   Increased nutrient needs related to wound healing as evidenced by estimated needs, ongoing   GOAL:   Patient will meet greater than or equal to 90% of their needs, unmet  MONITOR:   PO intake, Supplement acceptance, Labs, Weight trends, Skin, I & O's  ASSESSMENT:   49 y.o. Male with history of alcohol abuse, OSA, stage III CKD, who presented with localized infection and scrotum which has been evaluated by urology and altered mental status.   Chart reviewed.  Pt recently agitated and refusing medications.  PO intake very poor at 10-25% per flowsheet records.  Refused breakfast this AM.  Ensure Enlive supplements in place.  Palliative Care Team following.  Focus is comfort, however, family is unable to fully transition.  Diet Order:  Diet heart healthy/carb modified Room service appropriate?: Yes; Fluid consistency:: Thin; Fluid restriction:: 1200 mL Fluid  Skin:  Wound (see comment) (chronic scrotal wound with eschar)  Last BM:  10/13  Height:   Ht Readings from Last 1 Encounters:  12/12/14 5\' 7"  (1.702 m)    Weight:   Wt Readings from Last 1 Encounters:  12/26/14 250 lb 7.1 oz (113.6 kg)    Ideal Body Weight:  67.2 kg  BMI:  Body mass index is 39.22 kg/(m^2).  Estimated Nutritional Needs:   Kcal:  2100-2300  Protein:  120-130 gm  Fluid:  2.1-2.3 L  EDUCATION NEEDS:   No education needs identified at this time  Tom ChattersKatie Anslie Reynolds, RD, LDN Pager #: [phone] After-Hours Pager #: [phone]

## 2014-12-26 NOTE — Progress Notes (Signed)
[facility] PROGRESS NOTE  Jaiyon Wander ZOX:[id number] DOB: 1965/11/12 DOA: 12/11/2014 PCP: No primary care provider on file.  Brief interval history HPI:  49 y.o. male accompanied by family, with a history of substance abuse, DM, HTN, alcoholic cardiomyopathy/chronic systolic heart failure with an EF of 20-25% and chronic kidney disease stage II with baseline creatinine of 1.2, who presented at [facility] with worsening shortness of breath over the last 2 weeks and worsening bilateral lower extremity edema associated with subjective fever, productive cough, genital pain and swelling of the scrotum. Patient also complained of chest pain that started 2 days ago, described as pleuritic in nature. Patient presented with similar complaints with worsening swelling of the scrotum and redness with difficulty urinating to [facility] ED on 9/27. On 9/27 patient was found by EMS naked in the local area and he was brought in but left AMA because of his court date. 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 Was found to have an elevated lactate, patient transferred to [facility] for further urology consult. CT scan done prior to transfer does not show evidence of Fournier's gangrene. Patient admitted to step down. Blood pressure soft 99 /63  Patient was admitted and placed in the step down unit. Patient was placed empirically on IV vancomycin IV Azactam for his lower extremity cellulitis and probable scrotal swelling and cellulitis. Patient was seen in consultation by urology who felt no was no abscess and felt  etiology of patient's scrotal swelling and cellulitis was secondary to poor hygiene and recommended continuation of IV antibiotics. Patient was also noted to be in acute renal failure and placed on IV fluids with some improvement in his renal function. Patient also noted to have anasarca from his lower extremities all the way up to his nipples. During the hospitalization patient was agitated belligerent and fighting with the staff and had to be placed in restraints and placed on IV Ativan and IV dilaudid. IV Dilaudid was discontinued. Patient was switched from the Ativan withdrawal protocol to the Valium withdrawal protocol. Psych was consulted to help with patient's agitation as well as capacity and patient did not have capacity. Patient was noted to have anasarca and be in acute on chronic systolic heart failure. 2-D echo which was done had a EF of 15-20%. Patient was diuresed with IV Lasix with good diuresis and cardiology was consulted and currently following. Patient is deemed to have no capacity and a such can be IVC for treatment. Patient will likely need skilled nursing facility on discharge. Patient's sister Clydie Braun is a healthcare power of attorney.     Assessment/Plan:  history of alcoholic cardiomyopathy/acute on chronic systolic heart failure with  - EF of 15-20%per 2 d echo 12/19/2014 - With history of noncompliance, recent discharge from [facility], not taking any of his home medication. - Initially requiring BiPAP, currently off BiPAP after aggressive diuresis. - CHF cardiology consult appreciated. -  started on lisinopril, Coreg, diuresis is managed by cardiology, strict ins and outs, daily weights. - Long-term prognosis is very poor per cardiology  anasarca Questionable etiology. Likely secondary to cardiomyopathy/acute on chronic CHF  exacerbation. Improving with diuresis  sepsis - sepsis felt to be secondary to bilateral lower extremity cellulitis, scrotal  cellulitis. had elevated lactic acid level of 3.83 . - Urine cultures were negative. Blood cultures with 1/2 coag neg. staph likely a contaminant. - Currently off IV antibiotics .   scrotal swelling and cellulitis/ LE cellulitis - afebrile. WBC has trended down. Patient still with significant scrotal swelling. CT pelvis was negative for soft tissue gas or abscess.  - Patient was initially on IV vancomycin and IV Azactam, transitioned to clindamycin , back on Azactam due to rash , currently off antibiotics . - Continue with wound care  - Urology consult appreciated .   acute encephalopathy - multifactorial secondary to alcohol withdrawal , metabolic related to infection initially.   - acute renal failure chronic kidney disease stage II - Baseline renal function is 1.26. On admission patient was up to 2.32.  - Resolved, back to baseline   hyponatremia - Resolved.   ETOH abuse Patient likely going through withdrawal with agitation. Per nursing improvement with agitation with change from ativan to valium on withdrawal protocol. Thiamine. Folic acid. Follow.   bipolar disorder Patient with a history of bipolar disorder per patient and verified on care everywhere from her PCPs note. Patient states was on Zoloft 200 mg daily. Patient with improvement in agitation and aggressive behavior. Patient has been seen in consultation by psychiatry who recommended continuing safety sitter. Haldol has been discontinued  at family's insistence. Ativan 1 mg every 6 hours as needed for restlessness and agitation. Was recommended to continue Valium 5 mg IV as needed for uncontrollable anxiety/panic attacks. Appreciate psychiatric input.    gastroesophageal reflux disease - Pepcid.   euthyroid sick syndrome - TSH is elevated however free T4 within normal limits. Will need repeat thyroid function studies in about 4-6 weeks post discharge.   hypokalemia/hypomagnesemia Hypokalemia likely secondary to  diureses. Repleted. Keep magnesium greater than 2.   prophylaxis Pepcid for GI prophylaxis. Heparin for DVT prophylaxis.   prognosis - Overall poor prognosis, palliative care following, currently patient is DO NOT RESUSCITATE.   Code StatuS: DO NOT RESUSCITATE  Family Communication: No family at bedside.   Disposition Plan: Remain the step down unit   Consultants:  Urology: Dr. Neva SeatGreene 12/12/2014  Psychiatry: Dr. Elsie SaasJonnalagadda 12/19/2014  Cardiology: Dr. Wyline MoodBranch 12/20/2014  Palliative Care : Dr Linna DarnerAnwar 12/22/2014  Procedures:  CT pelvis 12/12/2014  Chest x-ray 12/11/2014  2-D echo 12/19/2014  Antibiotics:  IV ciprofloxacin 12/11/2014>>> 9/29/ 2016  IV clindamycin 12/11/2014>>> 12/11/2014  IV clindamycin 12/16/2014>>>> 12/17/2014  IV Azactam 12/12/2014>>> 12/15/2014  IV Azactam 12/17/2014>>>> 12/23/2014  IV vancomycin 12/11/2014>>>> 12/15/2014  HPI/Subjective: Patient off BiPAP overnight. Patient alert oriented to self, place . Patient denies any complaints.  Objective: Filed Vitals:   12/26/14 1235  BP: 113/80  Pulse: 109  Temp: 97.8 F (36.6 C)  Resp: 18    Intake/Output Summary (Last 24 hours) at 12/26/14 1427 Last data filed at 12/26/14 1100  Gross per 24 hour  Intake    360 ml  Output   2200 ml  Net  -1840 ml   Filed Weights   12/24/14 0600 12/25/14 0500 12/26/14 0500  Weight: 116.8 kg (257 lb 8 oz) 117.6 kg (259 lb 4.2 oz) 113.6 kg (250 lb 7.1 oz)    Exam:   General:  Awake, confused  Cardiovascular: Tachycardia with 3/6 SEM  Respiratory: Decreased BS in bases. Scattered crackles. No wheezing.  Abdomen: Soft, slight distention, positive  bowel sounds, no rebound, no guarding. Rash noted on the back in the lower torso improved. scrotal wound with ulceration. Scrotal swelling            Musculoskeletal: No clubbing or cyanosis. 2+ bilateral lower extremity edema up to mid abdomen improving.  Data Reviewed: Basic Metabolic  Panel:  Recent Labs Lab 12/20/14 0308  12/23/14 0316 12/23/14 1835 12/24/14 0355 12/25/14 0331 12/26/14 0249  NA 139  < > 142 141 140 138 138  K 4.8  < > 2.5* 3.6 4.3 4.2 3.5  CL 103  < > 95* 95* 92* 92* 87*  CO2 25  < > 38* 35* 36* 35* 38*  GLUCOSE 141*  < > 125* 129* 119* 147* 128*  BUN 24*  < > 12 16 15 15 14   CREATININE 1.07  < > 0.83 0.94 0.98 0.91 0.86  CALCIUM 8.4*  < > 8.1* 8.0* 8.0* 7.9* 7.9*  MG 2.0  --  1.3*  --  1.9 1.7  --   < > = values in this interval not displayed. Liver Function Tests: No results for input(s): AST, ALT, ALKPHOS, BILITOT, PROT, ALBUMIN in the last 168 hours. No results for input(s): LIPASE, AMYLASE in the last 168 hours.  Recent Labs Lab 12/25/14 0331  AMMONIA 36*   CBC:  Recent Labs Lab 12/21/14 1055 12/22/14 0623 12/23/14 0316 12/24/14 0355 12/25/14 0331  WBC 12.2* 10.1 8.8 9.3 7.6  HGB 11.0* 10.5* 10.5* 11.1* 10.4*  HCT 40.3 38.9* 38.7* 41.5 39.2  MCV 69.7* 69.5* 69.9* 70.7* 70.5*  PLT 292 281 236 328 314   Cardiac Enzymes:  Recent Labs Lab 12/20/14 0903 12/20/14 1650 12/20/14 1859  TROPONINI 0.03 0.03 0.04*   BNP (last 3 results)  Recent Labs  12/21/14 1055 12/22/14 0623 12/24/14 0355  BNP 1741.5* 1896.0* 1749.0*    ProBNP (last 3 results) No results for input(s): PROBNP in the last 8760 hours.  CBG:  Recent Labs Lab 12/25/14 1234 12/25/14 1753 12/25/14 2129 12/26/14 0744 12/26/14 1234  GLUCAP 133* 152* 169* 118* 142*    No results found for this or any previous visit (from the past 240 hour(s)).   Studies: No results found.  Scheduled Meds: . carvedilol  3.125 mg Oral BID WC  . collagenase   Topical Daily  . famotidine  20 mg Oral BID  . feeding supplement (ENSURE ENLIVE)  237 mL Oral BID BM  . folic acid  1 mg Oral Daily  . furosemide  80 mg Intravenous TID  . heparin  5,000 Units Subcutaneous 3 times per day  . insulin aspart  0-9 Units Subcutaneous TID WC  . lisinopril  2.5 mg Oral  BID  . multivitamin with minerals  1 tablet Oral Daily  . potassium chloride  40 mEq Oral BID  . senna-docusate  1 tablet Oral QHS  . spironolactone  12.5 mg Oral Daily  . thiamine  100 mg Oral Daily   Continuous Infusions:    Principal Problem:   Acute on chronic systolic heart failure (HCC) Active Problems:   Sepsis (HCC)   Acute renal failure superimposed on stage 3 chronic kidney disease (HCC)   Hyponatremia   OSA (obstructive sleep apnea)   Cellulitis of leg, right   Anasarca   Cellulitis   Scrotal swelling   Blood poisoning (HCC)   Rash and nonspecific skin eruption   Acute encephalopathy   Bipolar disorder (HCC)   Euthyroid sick syndrome   Palliative care encounter  Altered mental state    Time spent: 35 mins    Nylia Gavina MD [facility] Pager [phone]. If 7PM-7AM, please contact night-coverage at [email or web address], password [facility] 12/26/2014, 2:27 PM  LOS: 14 days

## 2014-12-26 NOTE — Progress Notes (Addendum)
Daily Progress Note   Patient Name: Tom Reynolds       Date: 12/26/2014 DOB: 11-Aug-1965  Age: 49 y.o. MRN#: [id number] Attending Physician: Starleen Arms, MD Primary Care Physician: No primary care provider on file. Admit Date: 12/11/2014  Reason for Consultation/Follow-up: GOC  Subjective:     Tom Reynolds is more lethargic again today although he does arouse to tell me "my scrotum" and when asked if this hurt he shook his head yes and then went back to sleep. I did speak with sister, Tom Braun, who says she has spoken to their other siblings and they are respecting Tom Reynolds's wishes for DNR. Tom Braun feels guilty that she cannot take him home as she cannot physically care for him (she also does not want him to die in her home) and even mentions that if he could rehab enough to help care for himself she could take him home - I was honest that he is very lethargic and could not participate in therapy in this state and that I do not see this realistically happening. RN says when he does wake up he is agitated and saying he is leaving and going home until he falls back asleep. He is wearing his oxygen today. Still not eating but will drink. I continue to recommend hospice facility as I believe prognosis is weeks or less given his current state and I have shared this with Tom Braun.   - DNR decided - Focus on comfort although not full comfort care right now - Discussed hospice facility although he refuses anywhere except "home" (sister seems more open to hospice after conversation yesterday) - Tom Reynolds said yesterday he is ready to die and has no concerns and does not want to die in the hospital.   I will have to follow up more tomorrow. Difficult situation.    Length of Stay: 14 days  Current Medications: Scheduled Meds:  . carvedilol  3.125 mg Oral BID WC  . collagenase   Topical Daily  . diphenhydrAMINE  25 mg Oral Once  . docusate sodium  100 mg Oral BID  . famotidine  20 mg Oral BID  .  feeding supplement (ENSURE ENLIVE)  237 mL Oral BID BM  . folic acid  1 mg Oral Daily  . furosemide  80 mg Intravenous TID  . heparin  5,000 Units Subcutaneous 3 times per day  . insulin aspart  0-9 Units Subcutaneous TID WC  . lisinopril  2.5 mg Oral BID  . multivitamin with minerals  1 tablet Oral Daily  . potassium chloride  40 mEq Oral BID  . senna  1 tablet Oral QHS  . spironolactone  12.5 mg Oral Daily  . thiamine  100 mg Oral Daily    Continuous Infusions:    PRN Meds: acetaminophen **OR** acetaminophen, diazepam, levalbuterol, LORazepam, morphine injection  Palliative Performance Scale: 30%     Vital Signs: BP 102/85 mmHg  Pulse 96  Temp(Src) 97.9 F (36.6 C) (Axillary)  Resp 11  Ht  (1.702 m)  Wt 113.6 kg (250 lb 7.1 oz)  BMI 39.22 kg/m2  SpO2 100% SpO2: SpO2: 100 % O2 Device: O2 Device: Nasal Cannula O2 Flow Rate: O2 Flow Rate (L/min): 5.5 L/min  Intake/output summary:   Intake/Output Summary (Last 24 hours) at 12/26/14 0931 Last data filed at 12/26/14 0811  Gross per 24 hour  Intake    120 ml  Output   3875 ml  Net  -3755 ml   LBM: Last BM  Date: 12/25/14 Baseline Weight: Weight: 125.278 kg (276 lb 3 oz) Most recent weight: Weight: 113.6 kg (250 lb 7.1 oz)  Physical Exam: General: NAD HEENT: [place]/AT CVS: Tachycardic Resp: No labored breathing periodically Abd: Soft, NT, ND, obese Extrem: BLE erythema, edema 3+ Neuro: Lethargic today   Additional Data Reviewed: Recent Labs     12/24/14  0355  12/25/14  0331  12/26/14  0249  WBC  9.3  7.6   --   HGB  11.1*  10.4*   --   PLT  328  314   --   NA  140  138  138  BUN  15  15  14   CREATININE  0.98  0.91  0.86     Problem List:  Patient Active Problem List   Diagnosis Date Noted  . Altered mental state   . Palliative care encounter 12/22/2014  . Euthyroid sick syndrome 12/21/2014  . Bipolar disorder (HCC) 12/19/2014  . Rash and nonspecific skin eruption 12/17/2014  . Acute  encephalopathy 12/17/2014  . Sepsis (HCC) 12/12/2014  . Acute renal failure superimposed on stage 3 chronic kidney disease (HCC) 12/12/2014  . Acute on chronic systolic heart failure (HCC) 12/12/2014  . Hyponatremia 12/12/2014  . OSA (obstructive sleep apnea) 12/12/2014  . Cellulitis of leg, right 12/12/2014  . Anasarca   . Cellulitis   . Scrotal swelling   . Blood poisoning [facility](HCC)      Palliative Care Assessment & Plan    Code Status:  DNR  Goals of Care:  Focus is on comfort but difficult situation and they are unable to fully transition to full comfort. Also unable to find disposition option even though he continues to want to leave the hospital.   Chaplain to see.   3. Symptom Management:  Morphine prn for pain/dyspnea.  Lorazepam prn for anxiety.   4. Palliative Prophylaxis:  Stool Softner: Senokot-S qhs.   5. Prognosis: Prognosis poor - days to weeks. I have shared with sister Tom BraunKaren.   5. Discharge Planning: Still undetermined. I do recommend hospice facility but pt/family not yet on board with this.    Thank you for allowing the Palliative Medicine Team to assist in the care of this patient.   Time In: 1430 Time Out: 1500 Total Time 30min Prolonged Time Billed  no    Greater than 50%  of this time was spent counseling and coordinating care related to the above assessment and plan.   Yong ChannelAlicia Halbert Jesson, NP Palliative Medicine Team Pager # [phone] (M-F 8a-5p) Team Phone # [phone] (Nights/Weekends)  12/26/2014, 9:31 AM

## 2014-12-27 LAB — BASIC METABOLIC PANEL
ANION GAP: 10 (ref 5–15)
BUN: 11 mg/dL (ref 6–20)
CO2: 44 mmol/L — ABNORMAL HIGH (ref 22–32)
Calcium: 8.2 mg/dL — ABNORMAL LOW (ref 8.9–10.3)
Chloride: 88 mmol/L — ABNORMAL LOW (ref 101–111)
Creatinine, Ser: 0.85 mg/dL (ref 0.61–1.24)
GFR calc non Af Amer: >60 mL/min (ref >60)
GLUCOSE: 110 mg/dL — AB (ref 65–99)
Potassium: 3.4 mmol/L — ABNORMAL LOW (ref 3.5–5.1)
SODIUM: 142 mmol/L (ref 135–145)

## 2014-12-27 LAB — GLUCOSE, CAPILLARY
GLUCOSE-CAPILLARY: 136 mg/dL — AB (ref 65–99)
GLUCOSE-CAPILLARY: 131 mg/dL — AB (ref 65–99)
Glucose-Capillary: 137 mg/dL — ABNORMAL HIGH (ref 65–99)
Glucose-Capillary: 163 mg/dL — ABNORMAL HIGH (ref 65–99)

## 2014-12-27 MED ORDER — POTASSIUM CHLORIDE CRYS ER 20 MEQ PO TBCR
40.0000 meq | EXTENDED_RELEASE_TABLET | Freq: Once | ORAL | Status: AC
  Administered 2014-12-27: 40 meq via ORAL
  Filled 2014-12-27: qty 2

## 2014-12-27 MED ORDER — OXYCODONE HCL 5 MG PO TABS
5.0000 mg | ORAL_TABLET | Freq: Four times a day (QID) | ORAL | Status: DC | PRN
  Administered 2014-12-27 – 2014-12-29 (×4): 5 mg via ORAL
  Filled 2014-12-27 (×4): qty 1

## 2014-12-27 MED ORDER — FLUCONAZOLE 200 MG PO TABS
200.0000 mg | ORAL_TABLET | Freq: Every day | ORAL | Status: DC
  Administered 2014-12-27 – 2014-12-28 (×2): 200 mg via ORAL
  Filled 2014-12-27 (×3): qty 1

## 2014-12-27 MED ORDER — NYSTATIN 100000 UNIT/GM EX POWD
Freq: Three times a day (TID) | CUTANEOUS | Status: DC
  Administered 2014-12-27 – 2014-12-31 (×6): via TOPICAL
  Filled 2014-12-27: qty 15

## 2014-12-27 NOTE — Progress Notes (Signed)
[facility] PROGRESS NOTE  Tom Reynolds ZOX:[id number] DOB: [birth year] DOA: 12/11/2014 PCP: No primary care provider on file.  Brief interval history HPI:  48 y.o. male accompanied by family, with a history of substance abuse, DM, HTN, alcoholic cardiomyopathy/chronic systolic heart failure with an EF of 20-25% and chronic kidney disease stage II with baseline creatinine of 1.2, who presented at [facility] with worsening shortness of breath over the last 2 weeks and worsening bilateral lower extremity edema associated with subjective fever, productive cough, genital pain and swelling of the scrotum. Patient also complained of chest pain that started 2 days ago, described as pleuritic in nature. Patient presented with similar complaints with worsening swelling of the scrotum and redness with difficulty urinating to [facility] ED on 9/27. On 9/27 patient was found by EMS naked in the local area and he was brought in but left AMA because of his court date. 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 Was found to have an elevated lactate, patient transferred to [facility] for further urology consult. CT scan done prior to transfer does not show evidence of Fournier's gangrene. Patient admitted to step down. Blood pressure soft 99 /63  Patient was admitted and placed in the step down unit. Patient was placed empirically on IV vancomycin IV Azactam for his lower extremity cellulitis and probable scrotal swelling and cellulitis. Patient was seen in consultation by urology who felt no was no abscess and felt  etiology of patient's scrotal swelling and cellulitis was secondary to poor hygiene and recommended continuation of IV antibiotics. Patient was also noted to be in acute renal failure and placed on IV fluids with some improvement in his renal function. Patient also noted to have anasarca from his lower extremities all the way up to his nipples. During the hospitalization patient was agitated belligerent and fighting with the staff and had to be placed in restraints and placed on IV Ativan and IV dilaudid. IV Dilaudid was discontinued. Patient was switched from the Ativan withdrawal protocol to the Valium withdrawal protocol. Psych was consulted to help with patient's agitation as well as capacity and patient did not have capacity. Patient was noted to have anasarca and be in acute on chronic systolic heart failure. 2-D echo which was done had a EF of 15-20%. Patient was diuresed with IV Lasix with good diuresis and cardiology was consulted and currently following. Patient is deemed to have no capacity and a such can be IVC for treatment. Patient will likely need skilled nursing facility on discharge. Patient's sister Clydie Braun is a healthcare power of attorney.     Assessment/Plan:  history of alcoholic cardiomyopathy/acute on chronic systolic heart failure with  - EF of 15-20%per 2 d echo 12/19/2014 - With history of noncompliance, recent discharge from [facility], not taking any of his home medication. - Initially requiring BiPAP, currently off BiPAP after aggressive diuresis. - CHF cardiology consult appreciated. -  started on lisinopril, Coreg, diuresis is managed by cardiology, currently on 80 mg IV 3 times a day , strict ins and outs, patient is -17 L since admission,  daily weights. - Long-term  prognosis is very poor per cardiology  anasarca  Likely secondary to cardiomyopathy/acute on chronic CHF exacerbation. Improving with diuresis  sepsis - sepsis felt to be  secondary to bilateral lower extremity cellulitis, scrotal cellulitis. had elevated lactic acid level of 3.83 . - Urine cultures were negative. Blood cultures with 1/2 coag neg. staph likely a contaminant. - Currently off IV antibiotics .   scrotal swelling and cellulitis/ LE cellulitis - afebrile. WBC has trended down. Patient still with significant scrotal swelling. CT pelvis was negative for soft tissue gas or abscess.  - Patient was initially on IV vancomycin and IV Azactam, transitioned to clindamycin , back on Azactam due to rash , currently off antibiotics . - Continue with wound care  - Urology consult appreciated . - We'll start on dose can and nystatin powder for fungal rash and buttocks and groin area.   acute encephalopathy - multifactorial secondary to alcohol withdrawal , metabolic related to infection initially.   - acute renal failure chronic kidney disease stage II - Baseline renal function is 1.26. On admission patient was up to 2.32.  - Resolved, back to baseline   hyponatremia - Resolved.   ETOH abuse - He initially patient with DTs,  Per nursing improvement with agitation with change from ativan to valium on withdrawal protocol. Thiamine. Folic acid. No significant requirement of Valium.   bipolar disorder Patient with a history of bipolar disorder per patient and verified on care everywhere from her PCPs note. Patient states was on Zoloft 200 mg daily. Patient with improvement in agitation and aggressive behavior. Patient has been seen in consultation by psychiatry who recommended continuing safety sitter. Haldol has been discontinued  at family's insistence. Ativan 1 mg every 6 hours as needed for restlessness and agitation. Was recommended to continue Valium 5 mg IV as needed for uncontrollable anxiety/panic attacks. Appreciate psychiatric input.    gastroesophageal reflux disease - Pepcid.   euthyroid sick syndrome - TSH is elevated however free T4 within  normal limits. Will need repeat thyroid function studies in about 4-6 weeks post discharge.   hypokalemia/hypomagnesemia Hypokalemia likely secondary to diureses. Repleted. Keep magnesium greater than 2.   prophylaxis Pepcid for GI prophylaxis. Heparin for DVT prophylaxis.    prognosis - Overall poor prognosis, palliative care following, currently patient is DO NOT RESUSCITATE.   Code StatuS: DO NOT RESUSCITATE  Family Communication: No family at bedside.   Disposition Plan: Remain the step down unit   Consultants:  Urology: Dr. Neva SeatGreene 12/12/2014  Psychiatry: Dr. Elsie SaasJonnalagadda 12/19/2014  Cardiology: Dr. Wyline MoodBranch 12/20/2014  Palliative Care : Dr Linna DarnerAnwar 12/22/2014  Procedures:  CT pelvis 12/12/2014  Chest x-ray 12/11/2014  2-D echo 12/19/2014  Antibiotics:  IV ciprofloxacin 12/11/2014>>> 9/29/ 2016  IV clindamycin 12/11/2014>>> 12/11/2014  IV clindamycin 12/16/2014>>>> 12/17/2014  IV Azactam 12/12/2014>>> 12/15/2014  IV Azactam 12/17/2014>>>> 12/23/2014  IV vancomycin 12/11/2014>>>> 12/15/2014  HPI/Subjective: Patient off BiPAP overnight. Patient alert oriented to self, place . Patient denies any complaints.  Objective: Filed Vitals:   12/27/14 0800  BP: 102/83  Pulse: 93  Temp:   Resp: 12    Intake/Output Summary (Last 24 hours) at 12/27/14 1116 Last data filed at 12/27/14 1116  Gross per 24 hour  Intake    975 ml  Output   5050 ml  Net  -4075 ml   Filed Weights   12/25/14 0500 12/26/14 0500 12/27/14 0353  Weight: 117.6 kg (259 lb 4.2 oz) 113.6 kg (250 lb 7.1 oz) 109.7 kg (241  lb 13.5 oz)    Exam:   General:  Awake, confused, more pleasant today  Cardiovascular: Tachycardia with 3/6 SEM  Respiratory: Decreased BS in bases. Scattered crackles. No wheezing.  Abdomen: Soft, slight distention, positive bowel sounds, no rebound, no guarding. Rash noted on the back in the lower torso . scrotal wound with ulceration. Scrotal swelling             Musculoskeletal: No clubbing or cyanosis. 2+ bilateral lower extremity edema  improving.  Data Reviewed: Basic Metabolic Panel:  Recent Labs Lab 12/23/14 0316 12/23/14 1835 12/24/14 0355 12/25/14 0331 12/26/14 0249 12/27/14 0345  NA 142 141 140 138 138 142  K 2.5* 3.6 4.3 4.2 3.5 3.4*  CL 95* 95* 92* 92* 87* 88*  CO2 38* 35* 36* 35* 38* 44*  GLUCOSE 125* 129* 119* 147* 128* 110*  BUN CREATININE 0.83 0.94 0.98 0.91 0.86 0.85  CALCIUM 8.1* 8.0* 8.0* 7.9* 7.9* 8.2*  MG 1.3*  --  1.9 1.7  --   --    Liver Function Tests: No results for input(s): AST, ALT, ALKPHOS, BILITOT, PROT, ALBUMIN in the last 168 hours. No results for input(s): LIPASE, AMYLASE in the last 168 hours.  Recent Labs Lab 12/25/14 0331  AMMONIA 36*   CBC:  Recent Labs Lab 12/21/14 1055 12/22/14 0623 12/23/14 0316 12/24/14 0355 12/25/14 0331  WBC 12.2* 10.1 8.8 9.3 7.6  HGB 11.0* 10.5* 10.5* 11.1* 10.4*  HCT 40.3 38.9* 38.7* 41.5 39.2  MCV 69.7* 69.5* 69.9* 70.7* 70.5*  PLT 292 281 236 328 314   Cardiac Enzymes:  Recent Labs Lab 12/20/14 1650 12/20/14 1859  TROPONINI 0.03 0.04*   BNP (last 3 results)  Recent Labs  12/21/14 1055 12/22/14 0623 12/24/14 0355  BNP 1741.5* 1896.0* 1749.0*    ProBNP (last 3 results) No results for input(s): PROBNP in the last 8760 hours.  CBG:  Recent Labs Lab 12/26/14 0744 12/26/14 1234 12/26/14 1706 12/26/14 2127 12/27/14 0819  GLUCAP 118* 142* 160* 164* 137*    No results found for this or any previous visit (from the past 240 hour(s)).   Studies: No results found.  Scheduled Meds: . carvedilol  3.125 mg Oral BID WC  . collagenase   Topical Daily  . famotidine  20 mg Oral BID  . feeding supplement (ENSURE ENLIVE)  237 mL Oral BID BM  . fluconazole  200 mg Oral Daily  . folic acid  1 mg Oral Daily  . furosemide  80 mg Intravenous TID  . heparin  5,000 Units Subcutaneous 3 times per day  . insulin aspart  0-9 Units  Subcutaneous TID WC  . lisinopril  2.5 mg Oral BID  . multivitamin with minerals  1 tablet Oral Daily  . nystatin   Topical TID  . potassium chloride  40 mEq Oral BID  . senna-docusate  1 tablet Oral QHS  . spironolactone  12.5 mg Oral Daily  . thiamine  100 mg Oral Daily   Continuous Infusions:    Principal Problem:   Acute on chronic systolic heart failure (HCC) Active Problems:   Sepsis (HCC)   Acute renal failure superimposed on stage 3 chronic kidney disease (HCC)   Hyponatremia   OSA (obstructive sleep apnea)   Cellulitis of leg, right   Anasarca   Cellulitis   Scrotal swelling   Blood poisoning (HCC)   Rash and nonspecific skin eruption   Acute encephalopathy   Bipolar  disorder [facility])   Euthyroid sick syndrome   Palliative care encounter   Altered mental state    Time spent: 35 mins    Emalyn Schou MD [facility] Pager [phone]. If 7PM-7AM, please contact night-coverage at [email or web address], password [facility] 12/27/2014, 11:16 AM  LOS: 15 days

## 2014-12-27 NOTE — Progress Notes (Signed)
Pt agitated, uncooperative, continues to pull off O2 even after explaining the importance of keeping it on.  Pt threw his sherbert on floor and then proceeded to throw everything off of his bedside table.  I explained that if he continued I would have to put him back in wrist restraints. Paged MD.  Awaiting orders.

## 2014-12-27 NOTE — Progress Notes (Signed)
pts bp 80's - 90's / 50's-6-'s. o2 sat 98 on RA, hr 100. Alert, oriented. Notified md of bp.  Order to hold next dose of lasix.

## 2014-12-27 NOTE — Progress Notes (Addendum)
Patient ID: Tom Reynolds, male   DOB: 1966-02-20, 49 y.o.   MRN: [id number]  Advanced Heart Failure Rounding Note  Referring Physician: Dr. Randol Kern PCP: None on file Primary Cardiologist: None  Reason for consultation: Alcoholic cardiomyopathy with LVEF 15-20%  Subjective:    Say he feels better.  Says that he is going to leave today because he owns the building.   Objective:   Weight Range: 241 lb 13.5 oz (109.7 kg) Body mass index is 37.87 kg/(m^2).   Vital Signs:   Temp:  [97.8 F (36.6 C)-98.4 F (36.9 C)] 97.8 F (36.6 C) (10/15 1203) Pulse Rate:  [93-106] 93 (10/15 0800) Resp:  [12-27] 12 (10/15 0800) BP: (95-112)/(66-83) 102/83 mmHg (10/15 0800) SpO2:  [91 %-100 %] 100 % (10/15 0800) Weight:  [241 lb 13.5 oz (109.7 kg)] 241 lb 13.5 oz (109.7 kg) (10/15 0353) Last BM Date: 12/25/14  Weight change: Filed Weights   12/25/14 0500 12/26/14 0500 12/27/14 0353  Weight: 259 lb 4.2 oz (117.6 kg) 250 lb 7.1 oz (113.6 kg) 241 lb 13.5 oz (109.7 kg)    Intake/Output:   Intake/Output Summary (Last 24 hours) at 12/27/14 1606 Last data filed at 12/27/14 1359  Gross per 24 hour  Intake    735 ml  Output   4550 ml  Net  -3815 ml     Physical Exam: General:  Chronically ill appearing. Soft restrains in place. HEENT: normal Neck: supple. JVP 12 cm. Carotids 2+ bilat; no bruits. No lymphadenopathy or thyromegaly. Cor: PMI nondisplaced. RRR. No rubs, gallops. Pericardial friction rub.  Lungs: Clear anterior and lateral. Abdomen: Obese, soft, NT, mild distention. No HSM. No bruits or masses. +BS Extremities: Multiple wounds and cellulitic changes. 1+ edema to knees bilaterally. Neuro: Alert and oriented x 3 (person, place, and disease state), cranial nerves grossly intact. moves all 4 extremities w/o difficulty. Affect flat, agitated  Telemetry: Sinus Tach 100s  Labs: CBC  Recent Labs  12/25/14 0331  WBC 7.6  HGB 10.4*  HCT 39.2  MCV 70.5*  PLT 314   Basic  Metabolic Panel  Recent Labs  12/25/14 0331 12/26/14 0249 12/27/14 0345  NA 138 138 142  K 4.2 3.5 3.4*  CL 92* 87* 88*  CO2 35* 38* 44*  GLUCOSE 147* 128* 110*  BUN CREATININE 0.91 0.86 0.85  CALCIUM 7.9* 7.9* 8.2*  MG 1.7  --   --    Liver Function Tests No results for input(s): AST, ALT, ALKPHOS, BILITOT, PROT, ALBUMIN in the last 72 hours. No results for input(s): LIPASE, AMYLASE in the last 72 hours. Cardiac Enzymes No results for input(s): CKTOTAL, CKMB, CKMBINDEX, TROPONINI in the last 72 hours.  BNP: BNP (last 3 results)  Recent Labs  12/21/14 1055 12/22/14 0623 12/24/14 0355  BNP 1741.5* 1896.0* 1749.0*    ProBNP (last 3 results) No results for input(s): PROBNP in the last 8760 hours.   D-Dimer No results for input(s): DDIMER in the last 72 hours. Hemoglobin A1C No results for input(s): HGBA1C in the last 72 hours. Fasting Lipid Panel No results for input(s): CHOL, HDL, LDLCALC, TRIG, CHOLHDL, LDLDIRECT in the last 72 hours. Thyroid Function Tests No results for input(s): TSH, T4TOTAL, T3FREE, THYROIDAB in the last 72 hours.  Invalid input(s): FREET3  Other results:     Imaging/Studies:  No results found.  Latest Echo  Latest Cath   Medications:     Scheduled Medications: . carvedilol  3.125 mg Oral BID WC  .  collagenase   Topical Daily  . famotidine  20 mg Oral BID  . feeding supplement (ENSURE ENLIVE)  237 mL Oral BID BM  . fluconazole  200 mg Oral Daily  . folic acid  1 mg Oral Daily  . furosemide  80 mg Intravenous TID  . heparin  5,000 Units Subcutaneous 3 times per day  . insulin aspart  0-9 Units Subcutaneous TID WC  . lisinopril  2.5 mg Oral BID  . multivitamin with minerals  1 tablet Oral Daily  . nystatin   Topical TID  . potassium chloride  40 mEq Oral BID  . senna-docusate  1 tablet Oral QHS  . spironolactone  12.5 mg Oral Daily  . thiamine  100 mg Oral Daily    Infusions:    PRN  Medications: acetaminophen **OR** acetaminophen, diazepam, levalbuterol, LORazepam, oxyCODONE   Assessment/Plan   1. Acute on chronic systolic CHF: Echo 12/19/14 LVEF 15-20% with severe dilation and severely reduced RV function. Suspect this is an ETOH cardiomyopathy.  Remains significantly volume overload.  Out 18L this admission with more diuresis to go.  Creatinine stable. - Continue Lasix 80 mg IV every 8 hrs.  Replace K => give IV if refuses po.  - Continue coreg 3.125 BID.  If he is not committed into a facility, Kourtnie Sachs be concerning to have him on beta blocker with cocaine abuse potential.  - Continue lisinopril and spironolactone at current doses, he is sometimes refusing the po meds so Elmer Boutelle not change doses.   2. Pericardial friction rub: No pericardial abnormalities noted on echo, no chest pain.  3. Moderate to severe MR by echo: Likely functional MR from annular dilatation.  Continue diuresis 4. Code Status: Made DNR 12/24/14 after seeing palliative care.  He has been involuntarily committed.   Poor long term prognosis given lack of insight. Krishawn Vanderweele continue diuresis to get him as euvolemic as possible.  Length of Stay: 15  Sallie Staron Jorja LoaMartin Lorma Heater 12/27/2014 4:06 PM

## 2014-12-27 NOTE — Progress Notes (Signed)
Patient yelling out that he wants to leave. Becoming agitated with staff and family at bedside. Valium 5mg  iv given. Pt refuses to ambulate or get back into chair. Pt also has very poor po intake.

## 2014-12-27 NOTE — Progress Notes (Signed)
Pt refusing to take medications even after explaining the importance of taking them.  Offered valium to help him calm down and possibly sleep, pt refused that as well.  Sitter at bedside.  Continue to monitor.

## 2014-12-28 LAB — BASIC METABOLIC PANEL
Anion gap: 10 (ref 5–15)
BUN: 11 mg/dL (ref 6–20)
CHLORIDE: 88 mmol/L — AB (ref 101–111)
CO2: 43 mmol/L — ABNORMAL HIGH (ref 22–32)
CREATININE: 0.94 mg/dL (ref 0.61–1.24)
Calcium: 8.2 mg/dL — ABNORMAL LOW (ref 8.9–10.3)
GFR calc Af Amer: >60 mL/min (ref >60)
GFR calc non Af Amer: >60 mL/min (ref >60)
GLUCOSE: 120 mg/dL — AB (ref 65–99)
Potassium: 4.1 mmol/L (ref 3.5–5.1)
SODIUM: 141 mmol/L (ref 135–145)

## 2014-12-28 LAB — GLUCOSE, CAPILLARY
Glucose-Capillary: 165 mg/dL — ABNORMAL HIGH (ref 65–99)
Glucose-Capillary: 195 mg/dL — ABNORMAL HIGH (ref 65–99)
Glucose-Capillary: 107 mg/dL — ABNORMAL HIGH (ref 65–99)
Glucose-Capillary: 148 mg/dL — ABNORMAL HIGH (ref 65–99)

## 2014-12-28 LAB — MAGNESIUM: MAGNESIUM: 1.5 mg/dL — AB (ref 1.7–2.4)

## 2014-12-28 MED ORDER — SODIUM CHLORIDE 0.9 % IV BOLUS (SEPSIS)
250.0000 mL | Freq: Once | INTRAVENOUS | Status: AC
  Administered 2014-12-28: 250 mL via INTRAVENOUS

## 2014-12-28 MED ORDER — ONDANSETRON HCL 4 MG/2ML IJ SOLN
4.0000 mg | Freq: Four times a day (QID) | INTRAMUSCULAR | Status: DC | PRN
  Administered 2014-12-28: 4 mg via INTRAVENOUS
  Filled 2014-12-28: qty 2

## 2014-12-28 NOTE — Progress Notes (Signed)
Patient OOB in chair throughout AM.Restrains off and pt redirectable and cooperating. Refused new IV at this time. RN attempted to call sisters: Clydie BraunKaren & Darl PikesSusan regarding proposed room change at 13:00. Message left on Susan's answering machine.

## 2014-12-28 NOTE — Progress Notes (Signed)
Pt B/P 77/54, MD notified 250 NS bolus ordered. Will continue to monitor Pt

## 2014-12-28 NOTE — Progress Notes (Signed)
[facility] PROGRESS NOTE  Tom RohrerStephen XXXTeal ZOX:[id number] DOB: [birth year] DOA: 12/11/2014 PCP: No primary care provider on file.  Brief interval history HPI:  49 y.o. male accompanied by family, with a history of substance abuse, DM, HTN, alcoholic cardiomyopathy/chronic systolic heart failure with an EF of 20-25% and chronic kidney disease stage II with baseline creatinine of 1.2, who presented at [facility] with worsening shortness of breath over the last 2 weeks and worsening bilateral lower extremity edema associated with subjective fever, productive cough, genital pain and swelling of the scrotum. Patient also complained of chest pain that started 2 days ago, described as pleuritic in nature. Patient presented with similar complaints with worsening swelling of the scrotum and redness with difficulty urinating to [facility] ED on 9/27. On 9/27 patient was found by EMS naked in the local area and he was brought in but left AMA because of his court date. 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 Was found to have an elevated lactate, [facility]. CT scan done prior to transfer does not show evidence of Fournier's gangrene. Patient admitted to step down. Blood pressure soft 99 /63  Patient was admitted and placed in the step down unit. Patient was placed empirically on IV vancomycin IV Azactam for his lower extremity cellulitis and probable scrotal swelling and cellulitis. Patient was seen in consultation by urology who felt no was no abscess and felt  etiology of patient's scrotal swelling and cellulitis was secondary to poor hygiene and recommended continuation of IV antibiotics. Patient was also noted to be in acute renal failure and placed on IV fluids with some improvement in his renal function. Patient also noted to have anasarca from his lower extremities all the way up to his nipples. During the hospitalization patient was agitated belligerent and fighting with the staff and had to be placed in restraints and placed on IV Ativan and IV dilaudid. IV Dilaudid was discontinued. Patient was switched from the Ativan withdrawal protocol to the Valium withdrawal protocol. Psych was consulted to help with patient's agitation as well as capacity and patient did not have capacity. Patient was noted to have anasarca and be in acute on chronic systolic heart failure. 2-D echo which was done had a EF of 15-20%. Patient was diuresed with IV Lasix with good diuresis and cardiology was consulted and currently following. Patient is deemed to have no capacity and a such can be IVC for treatment. Patient will likely need skilled nursing facility on discharge. Patient's sister Clydie BraunKaren is a healthcare power of attorney.     Assessment/Plan:  history of alcoholic cardiomyopathy/acute on chronic systolic heart failure with  - EF of 15-20%per 2 d echo 12/19/2014 - With history of noncompliance, recent discharge from [facility], not taking any of his home medication. - Initially requiring BiPAP, currently off BiPAP after aggressive diuresis. - CHF cardiology consult appreciated. -  started on lisinopril, Coreg, and Aldactone ,diuresis is managed by cardiology, currently on 80 mg IV 3 times a day , strict ins and outs, patient is -18 L since admission,  daily weights,  held 1 dose of Lasix yesterday giving blood pressure on the lower side. - Long-term prognosis is very poor per cardiology  anasarca  Likely secondary to cardiomyopathy/acute on  chronic CHF exacerbation. Improving with diuresis  sepsis - sepsis felt to be secondary to bilateral lower extremity cellulitis, scrotal cellulitis. had elevated lactic acid level of 3.83 . - Urine cultures were negative. Blood cultures with 1/2 coag neg. staph likely a contaminant. - Currently off IV antibiotics .   scrotal swelling and cellulitis/ LE cellulitis - afebrile. WBC has trended down. Patient still with significant scrotal swelling. CT pelvis was negative for soft tissue gas or abscess.  - Patient was initially on IV vancomycin and IV Azactam, transitioned to clindamycin , back on Azactam due to rash , currently off antibiotics . - Continue with wound care  - Urology consult appreciated . - We'll start on dose can and nystatin powder for fungal rash and buttocks and groin area.   acute encephalopathy - multifactorial secondary to alcohol withdrawal , metabolic related to infection initially.   - acute renal failure chronic kidney disease stage II - Baseline renal function is 1.26. On admission patient was up to 2.32.  - Resolved, back to baseline   hyponatremia - Resolved.   ETOH abuse - He initially patient with DTs,  Per nursing improvement with agitation with change from ativan to valium on withdrawal protocol. Thiamine. Folic acid. No significant requirement of Valium.   bipolar disorder -  Appreciate psychiatric input.    gastroesophageal reflux disease - Pepcid.   euthyroid sick syndrome - TSH is elevated however free T4 within normal limits. Will need repeat thyroid function studies in about 4-6 weeks post discharge.   hypokalemia/hypomagnesemia Hypokalemia likely secondary to diureses.    prophylaxis Pepcid for GI prophylaxis. Heparin for DVT prophylaxis.    prognosis - Overall poor prognosis, palliative care following, currently patient is DO NOT RESUSCITATE. patient was seen by psychiatry, deemed to not have capacity, as well followed by palliative  care service, with episodes of confusion, agitation, requiring a sitter and restraints which remains a barrier to discharge.   Code StatuS: DO NOT RESUSCITATE  Family Communication: No family at bedside.   Disposition Plan: Transfer to telemetry  Consultants:  Urology: Dr. Neva Seat 12/12/2014  Psychiatry: Dr. Elsie Saas 12/19/2014  Cardiology: Dr. Wyline Mood 12/20/2014  Palliative Care : Dr Linna Darner 12/22/2014  Procedures:  CT pelvis 12/12/2014  Chest x-ray 12/11/2014  2-D echo 12/19/2014  Antibiotics:  IV ciprofloxacin 12/11/2014>>> 9/29/ 2016  IV clindamycin 12/11/2014>>> 12/11/2014  IV clindamycin 12/16/2014>>>> 12/17/2014  IV Azactam 12/12/2014>>> 12/15/2014  IV Azactam 12/17/2014>>>> 12/23/2014  IV vancomycin 12/11/2014>>>> 12/15/2014  HPI/Subjective: Patient alert oriented to self. 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 Scattered crackles. No wheezing.  Abdomen:  Soft, slight distention, positive bowel sounds, no rebound, no guarding. Rash noted on the back in the lower torso . scrotal wound with ulceration. Scrotal swelling            Musculoskeletal: No clubbing or cyanosis. 2+ bilateral lower extremity edema  improving.  Data Reviewed: Basic Metabolic Panel:  Recent Labs Lab 12/23/14 0316  12/24/14 0355 12/25/14 0331 12/26/14 0249 12/27/14 0345 12/28/14 0243  NA 142   < > 140 138 138 142 141  K 2.5*  < > 4.3 4.2 3.5 3.4* 4.1  CL 95*  < > 92* 92* 87* 88* 88*  CO2 38*  < > 36* 35* 38* 44* 43*  GLUCOSE 125*  < > 119* 147* 128* 110* 120*  BUN 12  < > CREATININE 0.83  < > 0.98 0.91 0.86 0.85 0.94  CALCIUM 8.1*  < > 8.0* 7.9* 7.9* 8.2* 8.2*  MG 1.3*  --  1.9 1.7  --   --  1.5*  < > = values in this interval not displayed. Liver Function Tests: No results for input(s): AST, ALT, ALKPHOS, BILITOT, PROT, ALBUMIN in the last 168 hours. No results for input(s): LIPASE, AMYLASE in the last 168 hours.  Recent Labs Lab 12/25/14 0331  AMMONIA 36*   CBC:  Recent Labs Lab 12/22/14 0623 12/23/14 0316 12/24/14 0355 12/25/14 0331  WBC 10.1 8.8 9.3 7.6  HGB 10.5* 10.5* 11.1* 10.4*  HCT 38.9* 38.7* 41.5 39.2  MCV 69.5* 69.9* 70.7* 70.5*  PLT 281 236 328 314   Cardiac Enzymes: No results for input(s): CKTOTAL, CKMB, CKMBINDEX, TROPONINI in the last 168 hours. BNP (last 3 results)  Recent Labs  12/21/14 1055 12/22/14 0623 12/24/14 0355  BNP 1741.5* 1896.0* 1749.0*    ProBNP (last 3 results) No results for input(s): PROBNP in the last 8760 hours.  CBG:  Recent Labs Lab 12/27/14 0819 12/27/14 1224 12/27/14 1559 12/27/14 2237 12/28/14 0845  GLUCAP 137* 163* 136* 131* 165*    No results found for this or any previous visit (from the past 240 hour(s)).   Studies: No results found.  Scheduled Meds: . carvedilol  3.125 mg Oral BID WC  . collagenase   Topical Daily  . famotidine  20 mg Oral BID  . feeding supplement (ENSURE ENLIVE)  237 mL Oral BID BM  . fluconazole  200 mg Oral Daily  . folic acid  1 mg Oral Daily  . furosemide  80 mg Intravenous TID  . heparin  5,000 Units Subcutaneous 3 times per day  . insulin aspart  0-9 Units Subcutaneous TID WC  . lisinopril  2.5 mg Oral BID  . multivitamin with minerals  1 tablet Oral Daily  . nystatin   Topical TID  . potassium chloride  40 mEq Oral BID  . senna-docusate  1  tablet Oral QHS  . spironolactone  12.5 mg Oral Daily  . thiamine  100 mg Oral Daily   Continuous Infusions:    Principal Problem:   Acute on chronic systolic heart failure (HCC) Active Problems:   Sepsis (HCC)   Acute renal failure superimposed on stage 3 chronic kidney disease (HCC)   Hyponatremia   OSA (obstructive sleep apnea)   Cellulitis of leg, right   Anasarca   Cellulitis   Scrotal swelling   Blood poisoning (HCC)   Rash and nonspecific skin eruption   Acute encephalopathy   Bipolar disorder (HCC)   Euthyroid sick syndrome  Palliative care encounter   Altered mental state    Time spent: 35 mins    Lake Cinquemani MD [facility] Pager [phone]. If 7PM-7AM, please contact night-coverage at [email or web address], password [facility] 12/28/2014, 11:17 AM  LOS: 16 days

## 2014-12-28 NOTE — Progress Notes (Signed)
Pt Tx from 2C in chair, sitter at bedside. Pt has new IV, placed by IV team. Pt states he is in a lot of pain, in his scrotal area.

## 2014-12-28 NOTE — Progress Notes (Signed)
Pt slept for several hours after dose of valium.  He is currently awake and yelling and refusing care.  Sitter at bedside.  Continue to monitor.

## 2014-12-28 NOTE — Progress Notes (Signed)
Patient ID: Tom Reynolds, male   DOB: 1965-04-06, 49 y.o.   MRN: [id number]  Advanced Heart Failure Rounding Note  Referring Physician: Dr. Randol KernElgergawy PCP: None on file Primary Cardiologist: None  Reason for consultation: Alcoholic cardiomyopathy with LVEF 15-20%  Subjective:    Say he feels better.  Wants to go home today.  Was combative with the staff yesterday requiring wrist restraints.  Had dose of lasix held due to decreased BP.     Objective:   Weight Range: 243 lb 9.7 oz (110.5 kg) Body mass index is 38.15 kg/(m^2).   Vital Signs:   Temp:  [97.7 F (36.5 C)-98 F (36.7 C)] 98 F (36.7 C) (10/16 0802) Pulse Rate:  [99-104] 100 (10/16 0802) Resp:  [12-20] 20 (10/16 0802) BP: (86-101)/(41-85) 101/85 mmHg (10/16 0802) SpO2:  [81 %-100 %] 96 % (10/16 0802) Weight:  [243 lb 9.7 oz (110.5 kg)] 243 lb 9.7 oz (110.5 kg) (10/16 0624) Last BM Date: 12/27/14  Weight change: Filed Weights   12/26/14 0500 12/27/14 0353 12/28/14 0624  Weight: 250 lb 7.1 oz (113.6 kg) 241 lb 13.5 oz (109.7 kg) 243 lb 9.7 oz (110.5 kg)    Intake/Output:   Intake/Output Summary (Last 24 hours) at 12/28/14 0910 Last data filed at 12/28/14 0900  Gross per 24 hour  Intake   1055 ml  Output   1500 ml  Net   -445 ml     Physical Exam: General:  Chronically ill appearing. Soft restrains in place. HEENT: normal Neck: supple. JVP 12 cm. Carotids 2+ bilat; no bruits. No lymphadenopathy or thyromegaly. Cor: PMI nondisplaced. RRR. No rubs, gallops. Rub present Lungs: Clear anterior and lateral. Abdomen: Obese, soft, NT, mild distention. No HSM. No bruits or masses. +BS Extremities: Multiple wounds and cellulitic changes. 1-2+ edema to knees bilaterally. Neuro: Alert and oriented x 3 (person, place, and disease state), cranial nerves grossly intact. moves all 4 extremities w/o difficulty. Affect flat, agitated  Telemetry: Sinus Tach 100s  Labs: CBC No results for input(s): WBC, NEUTROABS, HGB,  HCT, MCV, PLT in the last 72 hours. Basic Metabolic Panel  Recent Labs  12/27/14 0345 12/28/14 0243  NA 142 141  K 3.4* 4.1  CL 88* 88*  CO2 44* 43*  GLUCOSE 110* 120*  BUN 11 11  CREATININE 0.85 0.94  CALCIUM 8.2* 8.2*  MG  --  1.5*   Liver Function Tests No results for input(s): AST, ALT, ALKPHOS, BILITOT, PROT, ALBUMIN in the last 72 hours. No results for input(s): LIPASE, AMYLASE in the last 72 hours. Cardiac Enzymes No results for input(s): CKTOTAL, CKMB, CKMBINDEX, TROPONINI in the last 72 hours.  BNP: BNP (last 3 results)  Recent Labs  12/21/14 1055 12/22/14 0623 12/24/14 0355  BNP 1741.5* 1896.0* 1749.0*    ProBNP (last 3 results) No results for input(s): PROBNP in the last 8760 hours.   D-Dimer No results for input(s): DDIMER in the last 72 hours. Hemoglobin A1C No results for input(s): HGBA1C in the last 72 hours. Fasting Lipid Panel No results for input(s): CHOL, HDL, LDLCALC, TRIG, CHOLHDL, LDLDIRECT in the last 72 hours. Thyroid Function Tests No results for input(s): TSH, T4TOTAL, T3FREE, THYROIDAB in the last 72 hours.  Invalid input(s): FREET3  Other results:     Imaging/Studies:  No results found.  Latest Echo  Latest Cath   Medications:     Scheduled Medications: . carvedilol  3.125 mg Oral BID WC  . collagenase   Topical Daily  .  famotidine  20 mg Oral BID  . feeding supplement (ENSURE ENLIVE)  237 mL Oral BID BM  . fluconazole  200 mg Oral Daily  . folic acid  1 mg Oral Daily  . furosemide  80 mg Intravenous TID  . heparin  5,000 Units Subcutaneous 3 times per day  . insulin aspart  0-9 Units Subcutaneous TID WC  . lisinopril  2.5 mg Oral BID  . multivitamin with minerals  1 tablet Oral Daily  . nystatin   Topical TID  . potassium chloride  40 mEq Oral BID  . senna-docusate  1 tablet Oral QHS  . spironolactone  12.5 mg Oral Daily  . thiamine  100 mg Oral Daily    Infusions:    PRN Medications: acetaminophen  **OR** acetaminophen, diazepam, levalbuterol, LORazepam, oxyCODONE   Assessment/Plan   1. Acute on chronic systolic CHF: Echo 12/19/14 LVEF 15-20% with severe dilation and severely reduced RV function. Suspect this is an ETOH cardiomyopathy.  Remains significantly volume overload.  Out 18L this admission with more diuresis to go.  Creatinine stable. - Continue Lasix 80 mg IV every 8 hrs.  Replace K => give IV if refuses po.   - Continue coreg 3.125 BID.  If he is not committed into a facility, [name] be concerning to have him on beta blocker with cocaine abuse potential.  If he continues to have low BP, would switch to Toprol XL as this could help bring BP up. - Continue lisinopril and spironolactone at current doses, he is sometimes refusing the po meds so [name] not change doses.   2. Pericardial friction rub: No pericardial abnormalities noted on echo, no chest pain.  3. Moderate to severe MR by echo: Likely functional MR from annular dilatation.  Continue diuresis 4. Code Status: Made DNR 12/24/14 after seeing palliative care.  He has been involuntarily committed.   Poor long term prognosis given lack of insight. [name] continue diuresis to get him as euvolemic as possible.  Length of Stay: 16  [name] 12/28/2014 9:10 AM

## 2014-12-28 NOTE — Progress Notes (Signed)
Lenny Pastelom Callahan NP made aware of B/p [id number]. Lisinopril to be held as instructed. New orders for 250cc fluid bolus

## 2014-12-29 DIAGNOSIS — I959 Hypotension, unspecified: Secondary | ICD-10-CM

## 2014-12-29 DIAGNOSIS — R0602 Shortness of breath: Secondary | ICD-10-CM

## 2014-12-29 LAB — BASIC METABOLIC PANEL
ANION GAP: 8 (ref 5–15)
BUN: 12 mg/dL (ref 6–20)
CHLORIDE: 87 mmol/L — AB (ref 101–111)
CO2: 41 mmol/L — AB (ref 22–32)
Calcium: 8.1 mg/dL — ABNORMAL LOW (ref 8.9–10.3)
Creatinine, Ser: 1.03 mg/dL (ref 0.61–1.24)
GFR calc non Af Amer: >60 mL/min (ref >60)
Glucose, Bld: 124 mg/dL — ABNORMAL HIGH (ref 65–99)
POTASSIUM: 4.3 mmol/L (ref 3.5–5.1)
Sodium: 136 mmol/L (ref 135–145)

## 2014-12-29 LAB — GLUCOSE, CAPILLARY: GLUCOSE-CAPILLARY: 142 mg/dL — AB (ref 65–99)

## 2014-12-29 MED ORDER — GLYCOPYRROLATE 0.2 MG/ML IJ SOLN
0.2000 mg | INTRAMUSCULAR | Status: DC | PRN
  Administered 2014-12-29 – 2014-12-30 (×3): 0.2 mg via INTRAVENOUS
  Filled 2014-12-29 (×4): qty 1

## 2014-12-29 MED ORDER — LORAZEPAM 2 MG/ML IJ SOLN: 1.0000 mg | INTRAMUSCULAR | Status: DC | PRN

## 2014-12-29 MED ORDER — MORPHINE SULFATE (PF) 2 MG/ML IV SOLN
2.0000 mg | INTRAVENOUS | Status: DC | PRN
  Administered 2014-12-29 – 2014-12-31 (×7): 2 mg via INTRAVENOUS
  Filled 2014-12-29 (×8): qty 1

## 2014-12-29 NOTE — Progress Notes (Signed)
[facility] PROGRESS NOTE  Tom Reynolds ZOX:[id number] DOB: [birth year] DOA: 12/11/2014 PCP: No primary care provider on file.  Brief interval history HPI:  49 y.o. male accompanied by family, with a history of substance abuse, DM, HTN, alcoholic cardiomyopathy/chronic systolic heart failure with an EF of 20-25% and chronic kidney disease stage II with baseline creatinine of 1.2, who presented at [facility] with worsening shortness of breath over the last 2 weeks and worsening bilateral lower extremity edema associated with subjective fever, productive cough, genital pain and swelling of the scrotum. Patient also complained of chest pain that started 2 days ago, described as pleuritic in nature. Patient presented with similar complaints with worsening swelling of the scrotum and redness with difficulty urinating to [facility] ED on 9/27. On 9/27 patient was found by EMS naked in the local area and he was brought in but left AMA because of his court date. 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 Was found to have an elevated lactate, patient transferred to [facility] for further urology consult. CT scan done prior to transfer does not show evidence of Fournier's gangrene. Patient admitted to step down. Blood pressure soft 99 /63  Patient was admitted and placed in the step down unit. Patient was placed empirically on IV vancomycin IV Azactam for his lower extremity cellulitis and probable scrotal swelling and cellulitis. Patient was seen in consultation by urology who felt no was no abscess and felt  etiology of patient's scrotal swelling and cellulitis was secondary to poor hygiene and recommended continuation of IV antibiotics. Patient was also noted to be in acute renal failure and placed on IV fluids with some improvement in his renal function. Patient also noted to have anasarca from his lower extremities all the way up to his nipples. During the hospitalization patient was agitated belligerent and fighting with the staff and had to be placed in restraints and placed on IV Ativan and IV dilaudid. IV Dilaudid was discontinued. Patient was switched from the Ativan withdrawal protocol to the Valium withdrawal protocol. Psych was consulted to help with patient's agitation as well as capacity and patient did not have capacity. Patient was noted to have anasarca and be in acute on chronic systolic heart failure. 2-D echo which was done had a EF of 15-20%. Patient was diuresed with IV Lasix with good diuresis and cardiology was consulted and currently following. Patient is deemed to have no capacity and a such can be IVC for treatment. Patient will likely need skilled nursing facility on discharge. Patient's sister Clydie Braun is a healthcare power of attorney.  12/29/2014. - Patient continues to have clinical deterioration, started to become more hypotensive over the weekend, lisinopril, Aldactone, beta blockers, has been held, maintenance hypotensive despite fluid boluses, more hypoxic, worsening mentation, discussed with patient's sister, she wishes to proceed with full comfort measures, patient is been followed by palliative care.    Assessment/Plan:  history of alcoholic cardiomyopathy/acute on chronic systolic heart failure with  - EF of 15-20%per 2 d echo 12/19/2014 - With history of noncompliance, recent discharge from [facility], not taking any of his home medication. -  Long-term prognosis is very poor per cardiology - with clinical deterioration, at this point family  decided with full comfort measures.  anasarca  Likely secondary to cardiomyopathy/acute on chronic CHF exacerbation.   sepsis - sepsis felt to be secondary to bilateral lower extremity cellulitis, scrotal cellulitis. had elevated lactic acid level of 3.83 . - Urine cultures were negative. Blood cultures with 1/2 coag neg. staph likely a contaminant. - Currently off IV antibiotics .   scrotal swelling and cellulitis/ LE cellulitis - afebrile. WBC has trended down. Patient still with significant scrotal swelling. CT pelvis was negative for soft tissue gas or abscess.  - Patient was initially on IV vancomycin and IV Azactam, transitioned to clindamycin , back on Azactam due to rash , currently off antibiotics . - Urology consult appreciated .    acute encephalopathy - multifactorial secondary to alcohol withdrawal , metabolic related to infection initially.   - acute renal failure chronic kidney disease stage II - Baseline renal function is 1.26. On admission patient was up to 2.32.    hyponatremia - Resolved.   ETOH abuse - Treated with CIWA protocol   bipolar disorder -  Appreciate psychiatric input.    gastroesophageal reflux disease    euthyroid sick syndrome - TSH is elevated however free T4 within normal limits.    hypokalemia/hypomagnesemia  prognosis - Overall poor prognosis, palliative care following,  D/W HPOA today, patient with significant clinical deterioration, hypotensive, hypoxic, with altered mental status, at this point family wished to proceed with full comfort measure, especially with known overall very poor prognosis, poor life quality, and patient never wanted to be intubated.  Code StatuS: DO NOT RESUSCITATE /Comfort Care Family Communication: No family members at bedside, just with sister who is H by mouth a Disposition Plan: Pending clinical course over the next 24 hours, may need inpatient hospice,.  Consultants:  Urology: Dr. Neva Seat  12/12/2014  Psychiatry: Dr. Elsie Saas 12/19/2014  Cardiology: Dr. Wyline Mood 12/20/2014  Palliative Care : Dr Linna Darner 12/22/2014  Procedures:  CT pelvis 12/12/2014  Chest x-ray 12/11/2014  2-D echo 12/19/2014  Antibiotics:  IV ciprofloxacin 12/11/2014>>> 9/29/ 2016  IV clindamycin 12/11/2014>>> 12/11/2014  IV clindamycin 12/16/2014>>>> 12/17/2014  IV Azactam 12/12/2014>>> 12/15/2014  IV Azactam 12/17/2014>>>> 12/23/2014  IV vancomycin 12/11/2014>>>> 12/15/2014  HPI/Subjective: Patient is more lethargic, noncommunicative today.  Objective: Filed Vitals:   12/29/14 0748  BP: 84/50  Pulse:   Temp: 98.5 F (36.9 C)  Resp:     Intake/Output Summary (Last 24 hours) at 12/29/14 1116 Last data filed at 12/29/14 0800  Gross per 24 hour  Intake   1090 ml  Output    650 ml  Net    440 ml   Filed Weights   12/27/14 0353 12/28/14 0624 12/29/14 0536  Weight: 109.7 kg (241 lb 13.5 oz) 110.5 kg (243 lb 9.7 oz) 111.403 kg (245 lb 9.6 oz)    Exam:  General: Lethargic, ill-appearing Cardiovascular: Tachycardia  Respiratory: Decreased BS in bases. The Neck Abdomen: Soft, slight distention, positive bowel sounds, no rebound Muscular skeletal: +2 edema  Data Reviewed: Basic Metabolic Panel:  Recent Labs Lab 12/23/14 0316  12/24/14 0355 12/25/14 0331 12/26/14 0249 12/27/14 0345 12/28/14 0243 12/29/14 0402  NA 142  < > 140 138 138 142 141 136  K 2.5*  < > 4.3 4.2 3.5 3.4* 4.1 4.3  CL 95*  < > 92* 92* 87* 88* 88* 87*  CO2 38*  < > 36* 35* 38* 44* 43* 41*  GLUCOSE 125*  < > 119* 147* 128* 110* 120* 124*  BUN 12  < > 15 15 14 11 11 12   CREATININE 0.83  < > 0.98 0.91 0.86 0.85 0.94 1.03  CALCIUM 8.1*  < > 8.0* 7.9* 7.9* 8.2* 8.2* 8.1*  MG 1.3*  --  1.9 1.7  --   --  1.5*  --   < > = values in this interval not displayed. Liver Function Tests: No results for input(s): AST, ALT, ALKPHOS, BILITOT, PROT, ALBUMIN in the last 168 hours. No results for input(s):  LIPASE, AMYLASE in the last 168 hours.  Recent Labs Lab 12/25/14 0331  AMMONIA 36*   CBC:  Recent Labs Lab 12/23/14 0316 12/24/14 0355 12/25/14 0331  WBC 8.8 9.3 7.6  HGB 10.5* 11.1* 10.4*  HCT 38.7* 41.5 39.2  MCV 69.9* 70.7* 70.5*  PLT 236 328 314   Cardiac Enzymes: No results for input(s): CKTOTAL, CKMB, CKMBINDEX, TROPONINI in the last 168 hours. BNP (last 3 results)  Recent Labs  12/21/14 1055 12/22/14 0623 12/24/14 0355  BNP 1741.5* 1896.0* 1749.0*    ProBNP (last 3 results) No results for input(s): PROBNP in the last 8760 hours.  CBG:  Recent Labs Lab 12/28/14 0845 12/28/14 1238 12/28/14 1633 12/28/14 2110 12/29/14 0747  GLUCAP 165* 195* 107* 148* 142*    No results found for this or any previous visit (from the past 240 hour(s)).   Studies: No results found.  Scheduled Meds: . collagenase   Topical Daily  . nystatin   Topical TID   Continuous Infusions:    Principal Problem:   Acute on chronic systolic heart failure (HCC) Active Problems:   Sepsis (HCC)   Acute renal failure superimposed on stage 3 chronic kidney disease (HCC)   Hyponatremia   OSA (obstructive sleep apnea)   Cellulitis of leg, right   Anasarca   Cellulitis   Scrotal swelling   Blood poisoning (HCC)   Rash and nonspecific skin eruption   Acute encephalopathy   Bipolar disorder (HCC)   Euthyroid sick syndrome   Palliative care encounter   Altered mental state    Time spent: 30 mins    Nazli Penn MD [facility] Pager [phone]. If 7PM-7AM, please contact night-coverage at [email or web address], password [email or web address] 12/29/2014, 11:16 AM  LOS: 17 days

## 2014-12-29 NOTE — Progress Notes (Signed)
Patient ID: Tom Reynolds, male   DOB: [birth year], 49 y.o.   MRN: [id number]    Advanced Heart Failure Rounding Note  Referring Physician: Dr. Randol Kern PCP: None on file Primary Cardiologist: None  Reason for consultation: Alcoholic cardiomyopathy with LVEF 15-20%  Subjective:    Lasix held over the weekend with low BP, weight trending up. Received 80 mg IV lasix sat and Sunday.   Decompensated last night.  Hypotensive in 80s despite fluid boluses. 02 says in 70s this am, now on NRB. Has had decreased mentation through the night per RN. Now minimally responsive.   Objective:    Weight Range: 245 lb 9.6 oz (111.403 kg) Body mass index is 38.46 kg/(m^2).   Vital Signs:   Temp:  [98 F (36.7 C)-99 F (37.2 C)] 98.9 F (37.2 C) (10/17 0536) Pulse Rate:  [98-115] 115 (10/17 0536) Resp:  [16-23] 21 (10/17 0536) BP: (84-106)/(47-85) 90/59 mmHg (10/17 0536) SpO2:  [65 %-100 %] 92 % (10/17 0536) Weight:  [245 lb 9.6 oz (111.403 kg)] 245 lb 9.6 oz (111.403 kg) (10/17 0536) Last BM Date: 12/28/14  Weight change: Filed Weights   12/27/14 0353 12/28/14 0624 12/29/14 0536  Weight: 241 lb 13.5 oz (109.7 kg) 243 lb 9.7 oz (110.5 kg) 245 lb 9.6 oz (111.403 kg)    Intake/Output:   Intake/Output Summary (Last 24 hours) at 12/29/14 0715 Last data filed at 12/29/14 0537  Gross per 24 hour  Intake   1330 ml  Output   1450 ml  Net   -120 ml     Physical Exam: General:  Chronically ill appearing. Soft restrains in place. HEENT: normocephalic, atraumatic. Neck: supple. JVP to jaw. Carotids 2+ bilat; no bruits. No thyromegaly or nodule noted. Cor: PMI nondisplaced. Tachy, RRR. No rubs, gallops. Pericardial friction rub.  Lungs: Decreased bibasilar. Rapid respirations followed by periods of apnea. Abdomen: Obese, soft, NT, mild distention. No HSM. No bruits or masses. +BS Extremities: Multiple wounds and cellulitic changes. 1-2+ edema to knees bilaterally. Neuro: Minimally responsive  to painful stimuli  Telemetry: ST in 110s-120s  Labs: CBC No results for input(s): WBC, NEUTROABS, HGB, HCT, MCV, PLT in the last 72 hours. Basic Metabolic Panel  Recent Labs  12/28/14 0243 12/29/14 0402  NA 141 136  K 4.1 4.3  CL 88* 87*  CO2 43* 41*  GLUCOSE 120* 124*  BUN 11 12  CREATININE 0.94 1.03  CALCIUM 8.2* 8.1*  MG 1.5*  --    Liver Function Tests No results for input(s): AST, ALT, ALKPHOS, BILITOT, PROT, ALBUMIN in the last 72 hours. No results for input(s): LIPASE, AMYLASE in the last 72 hours. Cardiac Enzymes No results for input(s): CKTOTAL, CKMB, CKMBINDEX, TROPONINI in the last 72 hours.  BNP: BNP (last 3 results)  Recent Labs  12/21/14 1055 12/22/14 0623 12/24/14 0355  BNP 1741.5* 1896.0* 1749.0*    ProBNP (last 3 results) No results for input(s): PROBNP in the last 8760 hours.   D-Dimer No results for input(s): DDIMER in the last 72 hours. Hemoglobin A1C No results for input(s): HGBA1C in the last 72 hours. Fasting Lipid Panel No results for input(s): CHOL, HDL, LDLCALC, TRIG, CHOLHDL, LDLDIRECT in the last 72 hours. Thyroid Function Tests No results for input(s): TSH, T4TOTAL, T3FREE, THYROIDAB in the last 72 hours.  Invalid input(s): FREET3  Other results:     Imaging/Studies:  No results found.  Latest Echo  Latest Cath   Medications:     Scheduled Medications: . carvedilol  3.125 mg Oral BID WC  . collagenase   Topical Daily  . famotidine  20 mg Oral BID  . feeding supplement (ENSURE ENLIVE)  237 mL Oral BID BM  . fluconazole  200 mg Oral Daily  . folic acid  1 mg Oral Daily  . heparin  5,000 Units Subcutaneous 3 times per day  . insulin aspart  0-9 Units Subcutaneous TID WC  . lisinopril  2.5 mg Oral BID  . multivitamin with minerals  1 tablet Oral Daily  . nystatin   Topical TID  . potassium chloride  40 mEq Oral BID  . senna-docusate  1 tablet Oral QHS  . spironolactone  12.5 mg Oral Daily  . thiamine  100  mg Oral Daily    Infusions:    PRN Medications: acetaminophen **OR** acetaminophen, diazepam, levalbuterol, LORazepam, ondansetron, oxyCODONE   Assessment/Plan   1. Acute on chronic systolic CHF: Echo 12/19/14 LVEF 15-20% with severe dilation and severely reduced RV function. Suspect this is an ETOH cardiomyopathy.  Remains significantly volume overload.  Out 12L this admission and now trending back up with worsening condition. Creatinine stable. - Hold lasix, coreg, spiro, and lisinopril with hypotension. - K stable today. Can supp as needed for goal of K > 4 => can give IV 2. Acute respiratory failure - Now requiring 02 via NRB with cheyne-stokes respirations. 3. Pericardial friction rub: No pericardial abnormalities noted on echo, no chest pain.  4. Moderate to severe MR by echo: Likely functional MR from annular dilatation.  Continue diuresis 5. Code Status: Made DNR 12/24/14 after seeing palliative care.  He has been involuntarily committed.   Deteriation significant for worsening of short term prognosis. Will have palliative see him with family this morning.  They had previously discussed full comfort care, and may be approaching this.  Length of Stay: [address]17  Graciella FreerMichael Andrew Tillery PA-C 12/29/2014 7:15 AM  Advanced Heart Failure Team Pager [phone] (M-F; 7a - 4p)  Please contact  Cardiology for night-coverage after hours (4p -7a ) and weekends on [email or web address]  Patient seen with PA, agree with the above note.  Mr Corliss Marcuseal decompensated overnight, now hypotensive and hypoxic.  Would need intubation/pressors if we were to press forward. He is DNR.  Palliative care service here, discussed situation with family.  Plan to move towards full comfort care, which I think is appropriate.    Marca AnconaDalton Reynol Arnone 12/29/2014 8:01 AM

## 2014-12-29 NOTE — Progress Notes (Signed)
Had updated Tom Pastelom Callahan NP at 0600 - B/p 90/56 HR 90's RR 22 Sats 91. [employer] called to notify me of pt sats  down to 79%. Nasal O2 increased to 4L then 6L with no improvement.Pt now on NRM.Marland Kitchen. Respirations agonal. Call placed to patient's sister Clydie BraunKaren. Made aware of deterioration in pt's status.

## 2014-12-29 NOTE — Clinical Social Work Note (Signed)
CSW continues to follow patient for DC needs.    Roddie McBryant Demri Poulton MSW, [place] Stream[place]CSW, [place], [phone]

## 2014-12-29 NOTE — Progress Notes (Signed)
Daily Progress Note   Patient Name: Tom RohrerStephen Reynolds       Date: 12/29/2014 DOB: 28-Oct-1965  Age: 49 y.o. MRN#: [id number] Attending Physician: Starleen Armsawood S Elgergawy, MD Primary Care Physician: No primary care provider on file. Admit Date: 12/11/2014  Reason for Consultation/Follow-up: Establishing goals of care and Terminal care  Subjective:     Tom Reynolds has acutely changed since I last saw him on Friday and is minimally responsive (opening eyes to stimulation occasionally) with irregular breathing and appears to be actively dying. Multiple family members at bedside along with sister, Tom BraunKaren, whom I have had multiple conversations with regarding [name]'s wishes at EOL. She was supportive to him now to transition him to comfort care at this time and this was explained to the rest of family at bedside. I explained EOL and signs/symptoms they might see as he progresses. Offered emotional support and therapeutic listening.    Length of Stay: 17 days  Current Medications: Scheduled Meds:  . collagenase   Topical Daily  . nystatin   Topical TID    Continuous Infusions:    PRN Meds: acetaminophen **OR** acetaminophen, glycopyrrolate, levalbuterol, LORazepam, morphine injection, ondansetron  Palliative Performance Scale: 10%     Vital Signs: BP 84/50 mmHg  Pulse 115  Temp(Src) 98.5 F (36.9 C) (Axillary)  Resp 21  Ht 5\' 7"  (1.702 m)  Wt 111.403 kg (245 lb 9.6 oz)  BMI 38.46 kg/m2  SpO2 100% SpO2: SpO2: 100 % O2 Device: O2 Device: Nasal Cannula O2 Flow Rate: O2 Flow Rate (L/min): 6 L/min  Intake/output summary:  Intake/Output Summary (Last 24 hours) at 12/29/14 0813 Last data filed at 12/29/14 0537  Gross per 24 hour  Intake   1330 ml  Output   1450 ml  Net   -120 ml   LBM: Last BM Date: 12/28/14 Baseline Weight: Weight: 125.278 kg (276 lb 3 oz) Most recent weight: Weight: 111.403 kg (245 lb 9.6 oz)  Physical Exam: General: NAD, lying in bed HEENT: [place]/AT, lips  cyanotic, audible secretions CVS: Tachy Resp: Irreg Skin: Toes dusky Neuro: Minimally responsive, not following commands   Additional Data Reviewed: Recent Labs     12/28/14  0243  12/29/14  0402  NA  141  136  BUN  11  12  CREATININE  0.94  1.03     Problem List:  Patient Active Problem List   Diagnosis Date Noted  . Altered mental state   . Palliative care encounter 12/22/2014  . Euthyroid sick syndrome 12/21/2014  . Bipolar disorder (HCC) 12/19/2014  . Rash and nonspecific skin eruption 12/17/2014  . Acute encephalopathy 12/17/2014  . Sepsis (HCC) 12/12/2014  . Acute renal failure superimposed on stage 3 chronic kidney disease (HCC) 12/12/2014  . Acute on chronic systolic heart failure (HCC) 12/12/2014  . Hyponatremia 12/12/2014  . OSA (obstructive sleep apnea) 12/12/2014  . Cellulitis of leg, right 12/12/2014  . Anasarca   . Cellulitis   . Scrotal swelling   . Blood poisoning [facility](HCC)      Palliative Care Assessment & Plan    Code Status:  DNR  Goals of Care:  Comfort care.   Desire for further Chaplaincy support:no  3. Symptom Management:  Dyspnea/pain: Morphine 2 mg every hour prn.   Anxiety/agitation: Lorazepam 1-2 mg every 4 hours prn.   Secretions: Robinul 0.2 mg every 4 hours prn.    4. Prognosis: Hours - Days  5. Discharge Planning: Anticipate hospital death   Thank you for allowing  the Palliative Medicine Team to assist in the care of this patient.   Time In/Out: 0981-1914,  1500-1530 Total Time Prolonged Time Billed  yes     Greater than 50%  of this time was spent counseling and coordinating care related to the above assessment and plan.     Yong Channel, NP Palliative Medicine Team Pager # [phone] (M-F 8a-5p) Team Phone # [phone] (Nights/Weekends)  12/29/2014, 8:13 AM

## 2014-12-29 NOTE — Progress Notes (Signed)
Family at bedside. Pt remains unresponsive. + cheyne stoke respirations. VS obtained.  07:50 Dr Randol KernElgergawy at bedside.

## 2014-12-30 NOTE — Progress Notes (Signed)
[facility] PROGRESS NOTE  Tom Reynolds ZOX:[id number] DOB: 1965/07/01 DOA: 12/11/2014 PCP: No primary care provider on file.  Brief interval history HPI:  49 y.o. male accompanied by family, with a history of substance abuse, DM, HTN, alcoholic cardiomyopathy/chronic systolic heart failure with an EF of 20-25% and chronic kidney disease stage II with baseline creatinine of 1.2, who presented at [facility] with worsening shortness of breath over the last 2 weeks and worsening bilateral lower extremity edema associated with subjective fever, productive cough, genital pain and swelling of the scrotum. Patient also complained of chest pain that started 2 days ago, described as pleuritic in nature. Patient presented with similar complaints with worsening swelling of the scrotum and redness with difficulty urinating to [facility] ED on 9/27. On 9/27 patient was found by EMS naked in the local area and he was brought in but left AMA because of his court date. 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 Was found to have an elevated lactate, patient transferred to [facility] for further urology consult. CT scan done prior to transfer does not show evidence of Fournier's gangrene. Patient admitted to step down. Blood pressure soft 99 /63  Patient was admitted and placed in the step down unit. Patient was placed empirically on IV vancomycin IV Azactam for his lower extremity cellulitis and probable scrotal swelling and cellulitis. Patient was seen in consultation by urology who felt no was no abscess and felt  etiology of patient's scrotal swelling and cellulitis was secondary to poor hygiene and recommended continuation of IV antibiotics. Patient was also noted to be in acute renal failure and placed on IV fluids with some improvement in his renal function. Patient also noted to have anasarca from his lower extremities all the way up to his nipples. During the hospitalization patient was agitated belligerent and fighting with the staff and had to be placed in restraints and placed on IV Ativan and IV dilaudid. IV Dilaudid was discontinued. Patient was switched from the Ativan withdrawal protocol to the Valium withdrawal protocol. Psych was consulted to help with patient's agitation as well as capacity and patient did not have capacity. Patient was noted to have anasarca and be in acute on chronic systolic heart failure. 2-D echo which was done had a EF of 15-20%. Patient was diuresed with IV Lasix with good diuresis and cardiology was consulted and currently following. Patient is deemed to have no capacity and a such can be IVC for treatment. Patient will likely need skilled nursing facility on discharge. Patient's sister Tom Reynolds is a healthcare power of attorney.  12/29/2014. - Patient continues to have clinical deterioration, started to become more hypotensive over the weekend, lisinopril, Aldactone, beta blockers, has been held, maintenance hypotensive despite fluid boluses, more hypoxic, worsening mentation, discussed with patient's sister, she wishes to proceed with full comfort measures, patient is been followed by palliative care.    Assessment/Plan:  history of alcoholic cardiomyopathy/acute on chronic systolic heart failure with  - EF of 15-20%per 2 d echo 12/19/2014 - With history of noncompliance, recent discharge from [facility], not taking any of his home medication. -  Long-term prognosis is very poor per cardiology - with clinical deterioration, at this point family  decided with full comfort measures.  anasarca  Likely secondary to cardiomyopathy/acute on chronic CHF exacerbation.   sepsis - sepsis felt to be secondary to bilateral lower extremity cellulitis, scrotal cellulitis. had elevated lactic acid level of 3.83 . - Urine cultures were negative. Blood cultures with 1/2 coag neg. staph likely a contaminant. - Currently off IV antibiotics .   scrotal swelling and cellulitis/ LE cellulitis - afebrile. WBC has trended down. Patient still with significant scrotal swelling. CT pelvis was negative for soft tissue gas or abscess.  - Patient was initially on IV vancomycin and IV Azactam, transitioned to clindamycin , back on Azactam due to rash , currently off antibiotics . - Urology consult appreciated .    acute encephalopathy - multifactorial secondary to alcohol withdrawal , metabolic related to infection initially.   - acute renal failure chronic kidney disease stage II - Baseline renal function is 1.26. On admission patient was up to 2.32.    hyponatremia - Resolved.   ETOH abuse - Treated with CIWA protocol   bipolar disorder -  Appreciate psychiatric input.    gastroesophageal reflux disease    euthyroid sick syndrome - TSH is elevated however free T4 within normal limits.    hypokalemia/hypomagnesemia  prognosis - Overall poor prognosis, palliative care following,  D/W HPOA , patient with significant clinical deterioration, hypotensive, hypoxic, with altered mental status, at this point family wished to proceed with full comfort measure, especially with known overall very poor prognosis, poor life quality, and patient never wanted to be intubated. - Palliative care managing symptoms  Code StatuS: DO NOT RESUSCITATE /Comfort Care Family Communication: Spoke with HBO a sister at bedside. Disposition Plan: palliative  medicine following , may need residential hospice   Consultants:  Urology: Dr. Neva SeatGreene 12/12/2014  Psychiatry:  Dr. Elsie SaasJonnalagadda 12/19/2014  Cardiology: Dr. Wyline MoodBranch 12/20/2014  Palliative Care : Dr Linna DarnerAnwar 12/22/2014  Procedures:  CT pelvis 12/12/2014  Chest x-ray 12/11/2014  2-D echo 12/19/2014  Antibiotics:  IV ciprofloxacin 12/11/2014>>> 9/29/ 2016  IV clindamycin 12/11/2014>>> 12/11/2014  IV clindamycin 12/16/2014>>>> 12/17/2014  IV Azactam 12/12/2014>>> 12/15/2014  IV Azactam 12/17/2014>>>> 12/23/2014  IV vancomycin 12/11/2014>>>> 12/15/2014  HPI/Subjective: Patient is more lethargic, noncommunicative today.  Objective: Filed Vitals:   12/30/14 0500  BP: 91/57  Pulse: 122  Temp:   Resp:     Intake/Output Summary (Last 24 hours) at 12/30/14 1232 Last data filed at 12/30/14 0813  Gross per 24 hour  Intake      0 ml  Output      0 ml  Net      0 ml   Filed Weights   12/28/14 0624 12/29/14 0536 12/30/14 0500  Weight: 110.5 kg (243 lb 9.7 oz) 111.403 kg (245 lb 9.6 oz) 111 kg (244 lb 11.4 oz)    Exam:  General: Lethargic, ill-appearing Cardiovascular: Tachycardia  Respiratory: Decreased BS in bases. The Neck Abdomen: Soft, slight distention, positive bowel sounds, no rebound Muscular skeletal: +2 edema  Data Reviewed: Basic Metabolic Panel:  Recent Labs Lab 12/24/14 0355 12/25/14 0331 12/26/14 0249 12/27/14 0345 12/28/14 0243 12/29/14 0402  NA 140 138 138 142 141 136  K 4.3 4.2 3.5 3.4* 4.1 4.3  CL 92* 92* 87* 88* 88* 87*  CO2 36* 35* 38* 44* 43* 41*  GLUCOSE 119* 147* 128* 110* 120* 124*  BUN 15 15 14 11 11 12   CREATININE 0.98  0.91 0.86 0.85 0.94 1.03  CALCIUM 8.0* 7.9* 7.9* 8.2* 8.2* 8.1*  MG 1.9 1.7  --   --  1.5*  --    Liver Function Tests: No results for input(s): AST, ALT, ALKPHOS, BILITOT, PROT, ALBUMIN in the last 168 hours. No results for input(s): LIPASE, AMYLASE in the last 168 hours.  Recent Labs Lab 12/25/14 0331  AMMONIA 36*   CBC:  Recent Labs Lab 12/24/14 0355 12/25/14 0331  WBC 9.3 7.6  HGB 11.1* 10.4*  HCT 41.5  39.2  MCV 70.7* 70.5*  PLT 328 314   Cardiac Enzymes: No results for input(s): CKTOTAL, CKMB, CKMBINDEX, TROPONINI in the last 168 hours. BNP (last 3 results)  Recent Labs  12/21/14 1055 12/22/14 0623 12/24/14 0355  BNP 1741.5* 1896.0* 1749.0*    ProBNP (last 3 results) No results for input(s): PROBNP in the last 8760 hours.  CBG:  Recent Labs Lab 12/28/14 0845 12/28/14 1238 12/28/14 1633 12/28/14 2110 12/29/14 0747  GLUCAP 165* 195* 107* 148* 142*    No results found for this or any previous visit (from the past 240 hour(s)).   Studies: No results found.  Scheduled Meds: . collagenase   Topical Daily  . nystatin   Topical TID   Continuous Infusions:    Principal Problem:   Acute on chronic systolic heart failure (HCC) Active Problems:   Sepsis (HCC)   Acute renal failure superimposed on stage 3 chronic kidney disease (HCC)   Hyponatremia   OSA (obstructive sleep apnea)   Cellulitis of leg, right   Anasarca   Cellulitis   Scrotal swelling   Blood poisoning (HCC)   Rash and nonspecific skin eruption   Acute encephalopathy   Bipolar disorder (HCC)   Euthyroid sick syndrome   Palliative care encounter   Altered mental state   SOB (shortness of breath)    Time spent: 20 mins    Kohan Azizi MD [facility] Pager [phone]. If 7PM-7AM, please contact night-coverage at [email or web address], password [facility] 12/30/2014, 12:32 PM  LOS: 18 days

## 2014-12-30 NOTE — Progress Notes (Addendum)
Daily Progress Note   Patient Name: Tom Reynolds       Date: 12/30/2014 DOB: 1965-11-08  Age: 49 y.o. MRN#: [id number] Attending Physician: Rogelia RohrerStarleen Armsawood S Elgergawy, MD Primary Care Physician: No primary care provider on file. Admit Date: 12/11/2014  Reason for Consultation/Follow-up: Establishing goals of care and Terminal care  Subjective:     Tom SeniorStephen has had a rough night where he was more agitated and more awake at times. He does awaken today and is talking and joking. He will randomly burst into tears. When asked why he is crying he said that he was "scared" and asked of what he says "hell." Sister Tom Reynolds is at bedside and offers reassurance and spiritual support to him. He also tells us "I do not want to die in this side of the hospital." Tom Reynolds asked him if he would rather go to hospice and he shook his head "yes." She says she will talk with the other siblings about this. Emotional support provided to both. He is also noted to aspirate when he had a sip of pepsi and also spewed this out all over the bed. Then he started to complain of pain in his legs and shortness of breath - RN gave morphine.    Length of Stay: 18 days  Current Medications: Scheduled Meds:  . collagenase   Topical Daily  . nystatin   Topical TID    Continuous Infusions:    PRN Meds: acetaminophen **OR** acetaminophen, glycopyrrolate, levalbuterol, LORazepam, morphine injection, ondansetron  Palliative Performance Scale: 20%     Vital Signs: BP 91/61 mmHg  Pulse 118  Temp(Src) 99.9 F (37.7 C) (Oral)  Resp 26  Ht 5\' 7"  (1.702 m)  Wt 111 kg (244 lb 11.4 oz)  BMI 38.32 kg/m2  SpO2 97% SpO2: SpO2: 97 % O2 Device: O2 Device: Nasal Cannula O2 Flow Rate: O2 Flow Rate (L/min): 6 L/min  Intake/output summary:   Intake/Output Summary (Last 24 hours) at 12/30/14 1831 Last data filed at 12/30/14 1717  Gross per 24 hour  Intake    240 ml  Output    100 ml  Net    140 ml   LBM: Last BM Date:  12/28/14 Baseline Weight: Weight: 125.278 kg (276 lb 3 oz) Most recent weight: Weight: 111 kg (244 lb 11.4 oz)  Physical Exam: General: NAD, lying in bed HEENT: [place]/AT, lips cyanotic, audible secretions CVS: Tachy Neuro: More alert but alternates with sleeping   Additional Data Reviewed: Recent Labs     12/28/14  0243  12/29/14  0402  NA  141  136  BUN  11  12  CREATININE  0.94  1.03     Problem List:  Patient Active Problem List   Diagnosis Date Noted  . SOB (shortness of breath)   . Altered mental state   . Palliative care encounter 12/22/2014  . Euthyroid sick syndrome 12/21/2014  . Bipolar disorder (HCC) 12/19/2014  . Rash and nonspecific skin eruption 12/17/2014  . Acute encephalopathy 12/17/2014  . Sepsis (HCC) 12/12/2014  . Acute renal failure superimposed on stage 3 chronic kidney disease (HCC) 12/12/2014  . Acute on chronic systolic heart failure (HCC) 12/12/2014  . Hyponatremia 12/12/2014  . OSA (obstructive sleep apnea) 12/12/2014  . Cellulitis of leg, right 12/12/2014  . Anasarca   . Cellulitis   . Scrotal swelling   . Blood poisoning [facility](HCC)      Palliative Care Assessment & Plan    Code Status:  DNR  Goals  of Care:  Comfort care.   Desire for further Chaplaincy support: yes  3. Symptom Management:  Dyspnea/pain: Morphine 2 mg every hour prn.   Anxiety/agitation: Lorazepam 1-2 mg every 4 hours prn.   Secretions: Robinul 0.2 mg every 4 hours prn.    4. Prognosis: Hours - Days  5. Discharge Planning: Anticipate hospital death vs. Hospice facility   Thank you for allowing the Palliative Medicine Team to assist in the care of this patient.   Time In/Out: 1610-9604 Total Time Prolonged Time Billed  no     Greater than 50%  of this time was spent counseling and coordinating care related to the above assessment and plan.     Yong Channel, NP Palliative Medicine Team Pager # [phone] (M-F 8a-5p) Team Phone #  [phone] (Nights/Weekends)  12/30/2014, 6:31 PM

## 2014-12-30 NOTE — Care Management Note (Addendum)
Case Management Note  Patient Details  Name: Tom RohrerStephen XXXTeal MRN: [id number] Date of Birth: 12/31/1965  Subjective/Objective: Pt initially admitted for CP and  Sepsis-likely secondary to bilateral lower extremity cellulitis, scrotal infection, started on Vancomycin/ Zosyn. Pt with hx off ETOH Abuse. History of alcoholic cardiomyopathy/acute on chronic systolic heart failure with- EF of 15-20% per 2 d echo 12/19/2014. Pt has history of noncompliance with medications. Per MD notes: Long-term prognosis is very poor per cardiology- pt with clinical deterioration, at this point family decided with full comfort measures. Palliative Care is following the patient.                 Action/Plan: Pt is without insurance: Unable to GIP @ this time. CM did speak with NP Yong ChannelAlicia Parker with Palliative in ref to possible Residential Facility and if he could be transferred to Residential as a Charity Case since without insurance vs stay here for anticipation of Hospital Death. CM will continue to monitor for disposition needs.    Expected Discharge Date:                  Expected Discharge Plan:  Skilled Nursing Facility  In-House Referral:  Clinical Social Work, Museum/gallery exhibitions officerinancial Counselor  Discharge planning Services  CM Consult  Post Acute Care Choice:    Choice offered to:     DME Arranged:    DME Agency:     HH Arranged:    HH Agency:     Status of Service:  In process, will continue to follow  Medicare Important Message Given:    Date Medicare IM Given:    Medicare IM give by:    Date Additional Medicare IM Given:    Additional Medicare Important Message give by:     If discussed at Long Length of Stay Meetings, dates discussed:    Additional Comments: [address] Tomi BambergerBrenda Graves-Bigelow, RN,BSN [phone] Pt plan [facility]. CSW assisting with disposition needs.   Gala LewandowskyGraves-Bigelow, Tonae Livolsi Kaye, RN 12/30/2014, 10:46 AM

## 2014-12-31 MED ORDER — DIPHENHYDRAMINE HCL 12.5 MG/5ML PO ELIX
25.0000 mg | ORAL_SOLUTION | Freq: Four times a day (QID) | ORAL | Status: DC | PRN
  Administered 2014-12-31 (×2): 25 mg via ORAL
  Filled 2014-12-31 (×2): qty 10

## 2014-12-31 MED ORDER — LORAZEPAM 1 MG PO TABS: 1.0000 mg | ORAL_TABLET | ORAL | Status: AC | PRN

## 2014-12-31 MED ORDER — LORAZEPAM 1 MG PO TABS: 1.0000 mg | ORAL_TABLET | ORAL | Status: DC | PRN

## 2014-12-31 MED ORDER — MORPHINE SULFATE (CONCENTRATE) 10 MG/0.5ML PO SOLN: 5.0000 mg | ORAL | Status: AC | PRN

## 2014-12-31 MED ORDER — MORPHINE SULFATE (CONCENTRATE) 10 MG/0.5ML PO SOLN
5.0000 mg | ORAL | Status: DC | PRN
  Administered 2014-12-31: 5 mg via ORAL
  Filled 2014-12-31: qty 0.5

## 2014-12-31 MED ORDER — HYOSCYAMINE SULFATE 0.125 MG PO TBDP
0.2500 mg | ORAL_TABLET | ORAL | Status: DC | PRN
  Administered 2014-12-31: 0.25 mg via ORAL
  Filled 2014-12-31 (×3): qty 2

## 2014-12-31 MED ORDER — OXYCODONE HCL 20 MG/ML PO CONC
2.0000 mg | ORAL | Status: DC | PRN
  Administered 2014-12-31: 2 mg via ORAL
  Filled 2014-12-31: qty 1

## 2014-12-31 NOTE — Progress Notes (Signed)
[facility], ArchitectJoanne RN. Pt education and teaching provided to pt and pt family. Sister, Clydie BraunKaren, will be following transport to Hospice. Pt belongings gathered with sister Clydie BraunKaren and put into bags. Orders to transfer pt with foley catheter and 6L of 02. Pt resting comfortably awaiting discharge.

## 2014-12-31 NOTE — Discharge Summary (Signed)
Physician Discharge Summary  Tom Reynolds WGN:[id number] DOB: 1965-10-25 DOA: 12/11/2014  PCP: No primary care provider on file.  Admit date: 12/11/2014 Discharge date: 12/31/2014  Time spent: < 30 minutes  Recommendations for Outpatient Follow-up:  1. Discharged to residential hospice   Discharge Diagnoses:  Principal Problem:   Acute on chronic systolic heart failure (HCC) Active Problems:   Sepsis (HCC)   Acute renal failure superimposed on stage 3 chronic kidney disease (HCC)   Hyponatremia   OSA (obstructive sleep apnea)   Cellulitis of leg, right   Anasarca   Cellulitis   Scrotal swelling   Blood poisoning (HCC)   Rash and nonspecific skin eruption   Acute encephalopathy   Bipolar disorder (HCC)   Euthyroid sick syndrome   Palliative care encounter   Altered mental state   SOB (shortness of breath)  Diet recommendation: as tolerated  Filed Weights   12/28/14 0624 12/29/14 0536 12/30/14 0500  Weight: 110.5 kg (243 lb 9.7 oz) 111.403 kg (245 lb 9.6 oz) 111 kg (244 lb 11.4 oz)   History of present illness:  49 y.o. male accompanied by family, with a history of substance abuse, DM, HTN, alcoholic cardiomyopathy/chronic systolic heart failure with an EF of 20-25% and chronic kidney disease stage II with baseline creatinine of 1.2, who presents at [facility] with worsening shortness of breath over the last 2 weeks and worsening bilateral lower extremity edema associated with subjective fever, productive cough, genital pain and swelling of the scrotum. Patient also complained of chest pain that started 2 days ago, described as pleuritic in nature. Patient presented with similar complaints with worsening swelling of the scrotum and redness with difficulty urinating to [facility] ED on 9/27. On 9/27 patient was found by EMS naked in the local area and he was brought in but left AMA because of her court date. 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 Was found to have an elevated lactate, patient transferred to [facility] for further urology consult. CT scan done prior to transfer does not show evidence of Fournier's gangrene. Patient admitted to step down. Blood pressure soft 99 /63  Hospital Course:  Patient was admitted and placed in the step down unit. Patient was placed empirically on IV vancomycin IV Azactam for his lower extremity cellulitis and probable scrotal swelling and cellulitis. Patient was seen in consultation by urology who felt no was no abscess and felt etiology of patient's scrotal swelling and cellulitis was secondary to poor hygiene and recommended continuation of IV antibiotics. Patient was also noted to be in acute renal failure and placed on IV fluids with some improvement in his renal function. Patient also noted to have anasarca from his lower extremities all the way up to his nipples. During the hospitalization patient was agitated belligerent and fighting with the staff and had to be placed in restraints and placed on IV Ativan and IV dilaudid. IV Dilaudid was discontinued. Patient was switched from the Ativan withdrawal protocol to the Valium withdrawal protocol. Psych was consulted to help with patient's agitation as well as capacity and patient did not have capacity. Patient was noted to  have anasarca and be in acute on chronic systolic heart failure. 2-D echo which was done had a EF of 15-20%. Patient was diuresed with IV Lasix with good diuresis and cardiology was consulted. On 12/29/2014, patient continues to have clinical deterioration, started to  become more hypotensive more hypoxic, worsening mentation, discussed with patient's sister, she wishes to proceed with full comfort measures, patient is been followed by palliative care.   History of alcoholic cardiomyopathy/acute on chronic systolic heart failure - EF of 15-20%per 2 d echo 12/19/2014 - With history of noncompliance, recent discharge from [facility], not taking any of his home medication. - Long-term prognosis is very poor per cardiology - with clinical deterioration, at this point family decided with full comfort measures. Anasarca -Likely secondary to cardiomyopathy/acute on chronic CHF exacerbation.  Sepsis - sepsis felt to be secondary to bilateral lower extremity cellulitis, scrotal cellulitis. had elevated lactic acid level of 3.83 . - Urine cultures were negative. Blood cultures with 1/2 coag neg. staph likely a contaminant. - Currently off IV antibiotics Scrotal swelling and cellulitis/ LE cellulitis - afebrile. WBC has trended down. Patient still with significant scrotal swelling. CT pelvis was negative for soft tissue gas or abscess.  - Patient was initially on IV vancomycin and IV Azactam, transitioned to clindamycin , back on Azactam due to rash , currently off antibiotics as comfort care Acute encephalopathy - multifactorial secondary to alcohol withdrawal , metabolic related to infection initially. Acute renal failure chronic kidney disease stage II - Baseline renal function is 1.26. On admission patient was up to 2.32.  Hyponatremia - Resolved. ETOH abuse - Treated with CIWA protocol Bipolar disorder - Appreciate psychiatric input.  Gastroesophageal reflux disease Euthyroid sick syndrome - TSH is elevated however free T4 within normal limits.  Hypokalemia/hypomagnesemia Prognosis - Overall poor prognosis, palliative care following, D/W HPOA , patient with significant clinical deterioration, hypotensive, hypoxic, with altered mental  status, at this point family wished to proceed with full comfort measure, especially with known overall very poor prognosis, poor life quality, and patient never wanted to be intubated. Discharge to residential hospice  Procedures:  None    Consultations:  Cardiology   Urology  Palliative care  Discharge Exam: Filed Vitals:   12/30/14 1246 12/30/14 2136 12/31/14 0851 12/31/14 1007  BP: 91/61 94/61  92/66  Pulse: 118 120  87  Temp: 99.9 F (37.7 C) 99.2 F (37.3 C)  98.8 F (37.1 C)  TempSrc: Oral Axillary  Oral  Resp: Height:      Weight:      SpO2: 97% 93% 97% 91%   General: NAD Cardiovascular: RRR Respiratory: shallow breathing, no wheezing  Discharge Instructions     Medication List    STOP taking these medications        aspirin 81 MG tablet      TAKE these medications        furosemide 40 MG tablet  Commonly known as:  LASIX  Take 40 mg by mouth daily.     LORazepam 1 MG tablet  Commonly known as:  ATIVAN  Take 1 tablet (1 mg total) by mouth every 4 (four) hours as needed for anxiety (restlessness).     morphine CONCENTRATE 10 MG/0.5ML Soln concentrated solution  Take 0.25 mLs (5 mg total) by mouth every hour as needed for severe pain or shortness of breath (dyspnea, RR > 25).     omeprazole 40 MG capsule  Commonly  known as:  PRILOSEC  Take 40 mg by mouth daily.         The results of significant diagnostics from this hospitalization (including imaging, microbiology, ancillary and laboratory) are listed below for reference.    Significant Diagnostic Studies: Dg Chest 2 View  12/11/2014  CLINICAL DATA:  Shortness of breath and scrotal swelling EXAM: CHEST  2 VIEW COMPARISON:  December 09, 2014 FINDINGS: The heart size is enlarged. The mediastinal contour is normal. There is no focal infiltrate, pulmonary edema, or pleural effusion. Degenerative joint changes of the spine are noted. IMPRESSION: No active cardiopulmonary disease.   Cardiomegaly. Electronically Signed   By: Sherian ReinWei-Chen  Lin M.D.   On: 12/11/2014 21:33   Ct Pelvis Wo Contrast  12/12/2014  CLINICAL DATA:  Sores and scrotal thickening. Evaluate for 48 gangrene. EXAM: CT PELVIS WITHOUT CONTRAST TECHNIQUE: Multidetector CT imaging of the pelvis was performed following the standard protocol without intravenous contrast. COMPARISON:  None. FINDINGS: Diffuse, severe submucosal reticulation and skin thickening, including on the symptomatic scrotum. There is no soft tissue gas or evidence of fluid collection. No ulceration or bone infection. Reactive appearing bilateral inguinal lymph node enlargement. Small peritoneal fluid in the pelvis, likely from volume overload. Negative pelvic visceral. Lower lumbar facet arthropathy. IMPRESSION: Extensive anasarca, which could obscure superimposed cellulitis. No soft tissue gas or abscess. Electronically Signed   By: Marnee SpringJonathon  Watts M.D.   On: 12/12/2014 00:48   [place] Scrotum  12/11/2014  CLINICAL DATA:  Scrotal swelling for several weeks EXAM: SCROTAL ULTRASOUND DOPPLER ULTRASOUND OF THE TESTICLES TECHNIQUE: Complete ultrasound examination of the testicles, epididymis, and other scrotal structures was performed. Color and spectral Doppler ultrasound were also utilized to evaluate blood flow to the testicles. COMPARISON:  11/26/2014 FINDINGS: Right testicle Measurements: 41 x 28 x 29 mm. No mass or microlithiasis visualized. Left testicle Measurements: 38 x 28 x 28 mm. No mass or microlithiasis visualized. Right epididymis: Not able to be visualized. No indication of enlargement or hypervascularity. Left epididymis: Not able to be visualized. No indication of enlargement or hypervascularity. Hydrocele:  None visualized. Varicocele:  None visualized. Pulsed Doppler interrogation of both testes demonstrates normal low resistance arterial waveforms bilaterally. Detection and venous waveforms was more difficult, likely hindered by skin thickening.  Improved but still marked thickening of the scrotal wall diffusely, without evidence of collection or gas. IMPRESSION: 1. Scrotal wall thickening, improved from 11/26/2014 but still marked. No fluid collection. 2. Negative testicles. Electronically Signed   By: Marnee SpringJonathon  Watts M.D.   On: 12/11/2014 22:00   [place] Art/ven Flow Abd Pelv Doppler  12/11/2014  CLINICAL DATA:  Scrotal swelling for several weeks EXAM: SCROTAL ULTRASOUND DOPPLER ULTRASOUND OF THE TESTICLES TECHNIQUE: Complete ultrasound examination of the testicles, epididymis, and other scrotal structures was performed. Color and spectral Doppler ultrasound were also utilized to evaluate blood flow to the testicles. COMPARISON:  11/26/2014 FINDINGS: Right testicle Measurements: 41 x 28 x 29 mm. No mass or microlithiasis visualized. Left testicle Measurements: 38 x 28 x 28 mm. No mass or microlithiasis visualized. Right epididymis: Not able to be visualized. No indication of enlargement or hypervascularity. Left epididymis: Not able to be visualized. No indication of enlargement or hypervascularity. Hydrocele:  None visualized. Varicocele:  None visualized. Pulsed Doppler interrogation of both testes demonstrates normal low resistance arterial waveforms bilaterally. Detection and venous waveforms was more difficult, likely hindered by skin thickening. Improved but still marked thickening of the scrotal wall diffusely, without evidence of collection or gas.  IMPRESSION: 1. Scrotal wall thickening, improved from 11/26/2014 but still marked. No fluid collection. 2. Negative testicles. Electronically Signed   By: Marnee Spring M.D.   On: 12/11/2014 22:00   Dg Chest Port 1 View  12/22/2014  CLINICAL DATA:  Shortness of Breath EXAM: PORTABLE CHEST 1 VIEW COMPARISON:  December 20, 2014 FINDINGS: There is interstitial edema with right pleural effusion and cardiomegaly. There is pulmonary venous hypertension. There is atelectatic change in the bases. No  adenopathy. No bone lesions IMPRESSION: Congestive heart failure. No appreciable change from recent prior study. Electronically Signed   By: Bretta Bang III M.D.   On: 12/22/2014 07:00   Dg Chest Port 1 View  12/20/2014  CLINICAL DATA:  Shortness of breath.  Fluid overload. EXAM: PORTABLE CHEST 1 VIEW COMPARISON:  12/11/2014 chest radiograph. FINDINGS: Stable cardiomediastinal silhouette with moderate cardiomegaly. Slightly low lung volumes. No pneumothorax. Small bilateral pleural effusions, increased bilaterally. There is moderate pulmonary edema. Bibasilar hazy opacities, favor atelectasis. IMPRESSION: 1. Stable moderate cardiomegaly with moderate pulmonary edema, in keeping with moderate congestive heart failure. 2. Increased bilateral small pleural effusions with associated bibasilar lung opacities likely representing atelectasis. Electronically Signed   By: Delbert Phenix M.D.   On: 12/20/2014 10:03   Labs: Basic Metabolic Panel:  Recent Labs Lab 12/25/14 0331 12/26/14 0249 12/27/14 0345 12/28/14 0243 12/29/14 0402  NA 138 138 142 141 136  K 4.2 3.5 3.4* 4.1 4.3  CL 92* 87* 88* 88* 87*  CO2 35* 38* 44* 43* 41*  GLUCOSE 147* 128* 110* 120* 124*  BUN 15 14 11 11 12   CREATININE 0.91 0.86 0.85 0.94 1.03  CALCIUM 7.9* 7.9* 8.2* 8.2* 8.1*  MG 1.7  --   --  1.5*  --     Recent Labs Lab 12/25/14 0331  AMMONIA 36*   CBC:  Recent Labs Lab 12/25/14 0331  WBC 7.6  HGB 10.4*  HCT 39.2  MCV 70.5*  PLT 314   BNP: BNP (last 3 results)  Recent Labs  12/21/14 1055 12/22/14 0623 12/24/14 0355  BNP 1741.5* 1896.0* 1749.0*   CBG:  Recent Labs Lab 12/28/14 0845 12/28/14 1238 12/28/14 1633 12/28/14 2110 12/29/14 0747  GLUCAP 165* 195* 107* 148* 142*    Signed:  Maty Zeisler  [facility] 12/31/2014, 12:48 PM

## 2014-12-31 NOTE — Progress Notes (Signed)
   12/31/14 1400  Clinical Encounter Type  Visited With Patient and family together  Visit Type Spiritual support  Referral From Nurse  Spiritual Encounters  Spiritual Needs Emotional  Stress Factors  Patient Stress Factors Major life changes;Health changes;Loss of control  Family Stress Factors Other (Comment) (Spiritual concerns about patient)  Patient had previously expressed unwillingness to speak to chaplain but brother and sister indicated he had changed his mind. Chaplain offered general comfort and assurance while respecting patient's and family's belief.

## 2014-12-31 NOTE — Clinical Social Work Note (Signed)
Message left with Jacklynn GanongDiana Stephens with [facility] to make referral for the [facility].   Roddie McBryant Mozel Burdett MSW, [place]CSW, [place]ASA, [phone]

## 2014-12-31 NOTE — Clinical Social Work Note (Signed)
Per MD patient ready for DC to [facility]. RN, patient, patient's family, and facility notified of DC. RN given number for report. DC packet on chart. Ambulance transport requested for patient. CSW signing off.   Bryant Ethyl Vila MSW, LCSW, LCASA, [phone] 

## 2014-12-31 NOTE — Progress Notes (Signed)
Report attempted to [facility] nurse, left my phone # with Diplomatic Services operational officersecretary

## 2015-01-13 DEATH — deceased

## 2015-11-29 IMAGING — DX DG CHEST 1V PORT
1 series · 1 of 1 positions shown · non-contrast
Comparison: 12/11/2014 chest radiograph.

CLINICAL DATA: Shortness of breath.  Fluid overload.

EXAM:
PORTABLE CHEST 1 VIEW

[chest ap]
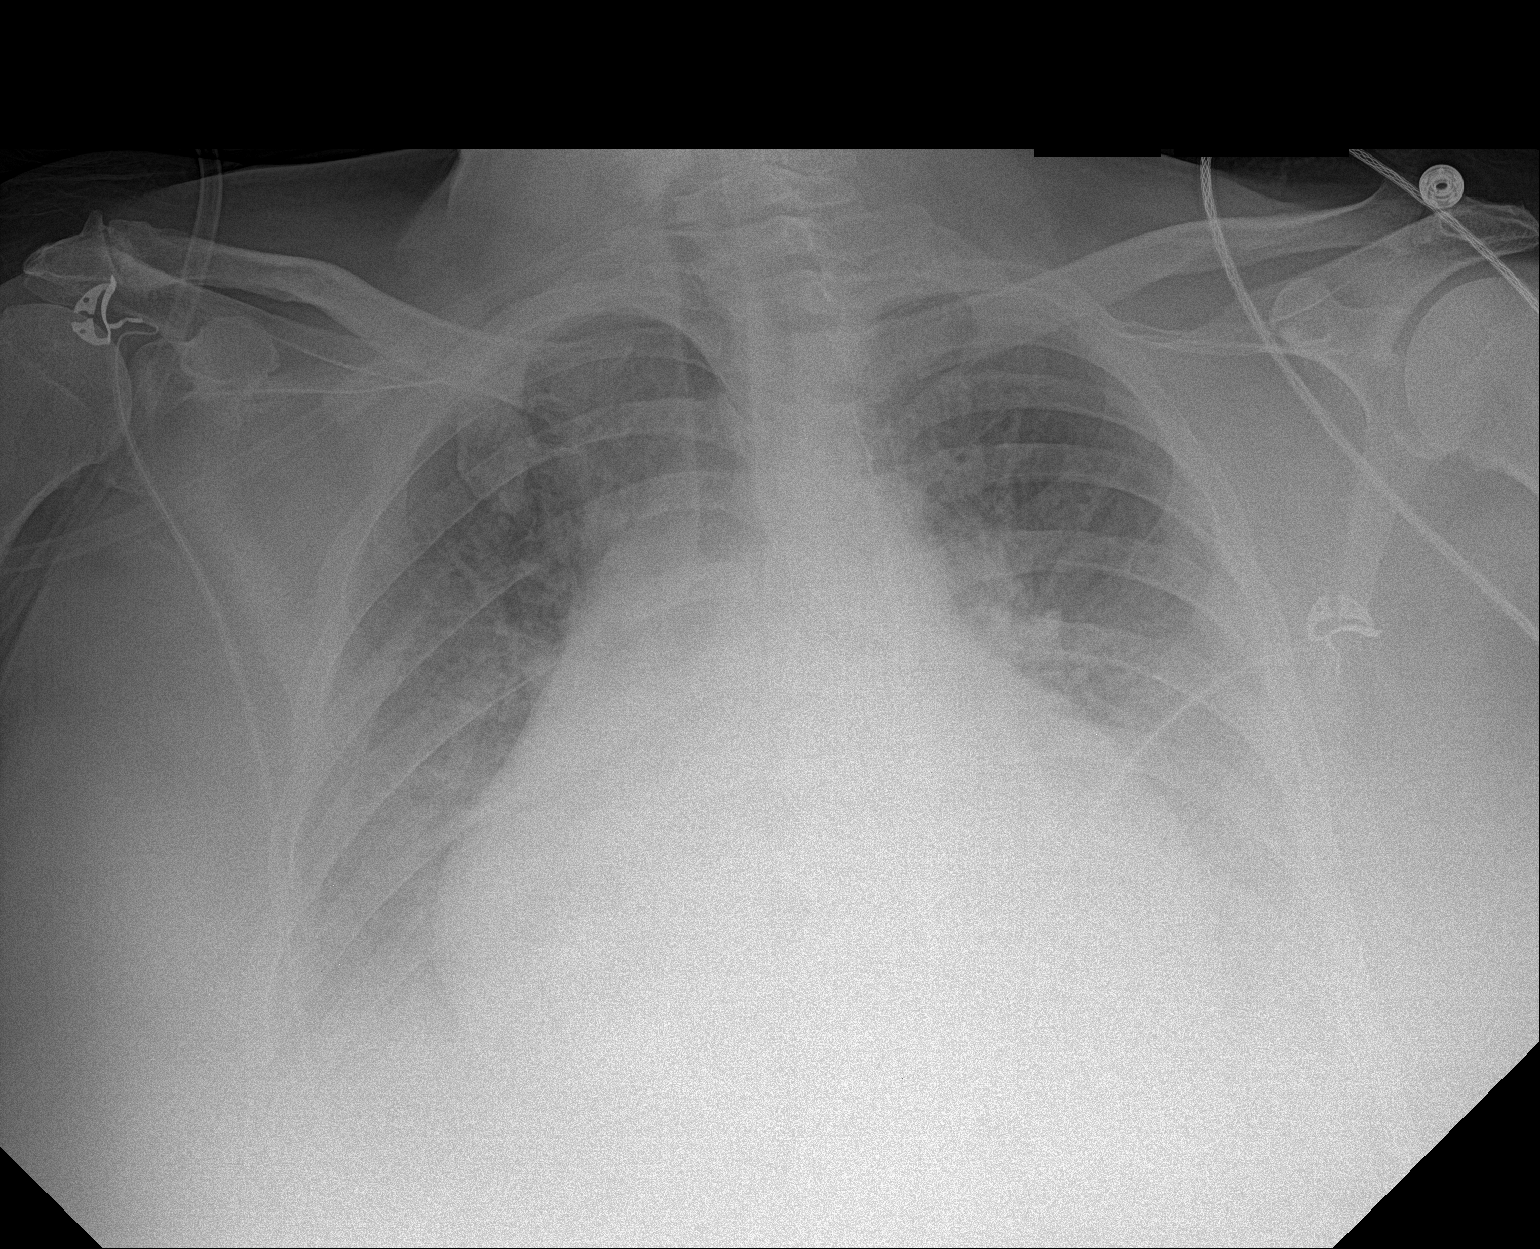

[1 of 1 positions shown; findings below may reference images not displayed]

FINDINGS: Stable cardiomediastinal silhouette with moderate cardiomegaly.
Slightly low lung volumes. No pneumothorax. Small bilateral pleural
effusions, increased bilaterally. There is moderate pulmonary edema.
Bibasilar hazy opacities, favor atelectasis.
IMPRESSION: 1. Stable moderate cardiomegaly with moderate pulmonary edema, in
keeping with moderate congestive heart failure.
2. Increased bilateral small pleural effusions with associated
bibasilar lung opacities likely representing atelectasis.

## 2017-07-13 IMAGING — US US SCROTUM
1 series · 13 of 25 positions shown · non-contrast
Comparison: 11/26/2014

CLINICAL DATA: Scrotal swelling for several weeks

EXAM:
SCROTAL ULTRASOUND
DOPPLER ULTRASOUND OF THE TESTICLES
TECHNIQUE: Complete ultrasound examination of the testicles, epididymis, and
other scrotal structures was performed. Color and spectral Doppler
ultrasound were also utilized to evaluate blood flow to the
testicles.

[Series 1: us scrotum · 29 acquisitions, 13 frames shown]
[im 1/29]
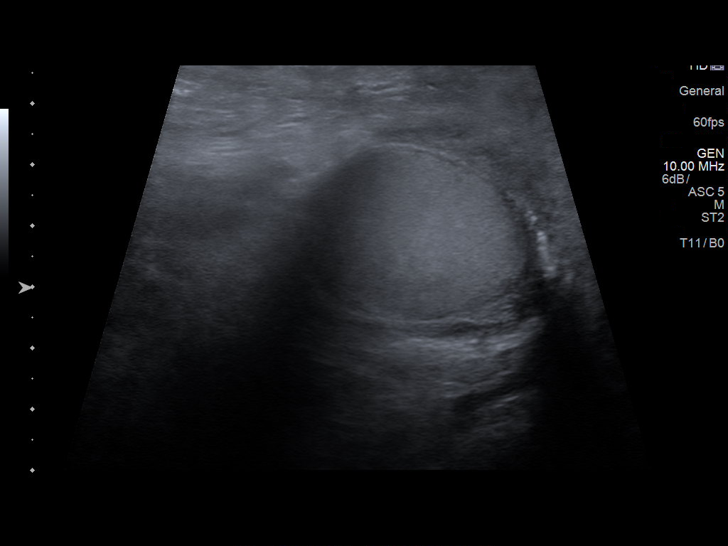
[im 3/29]
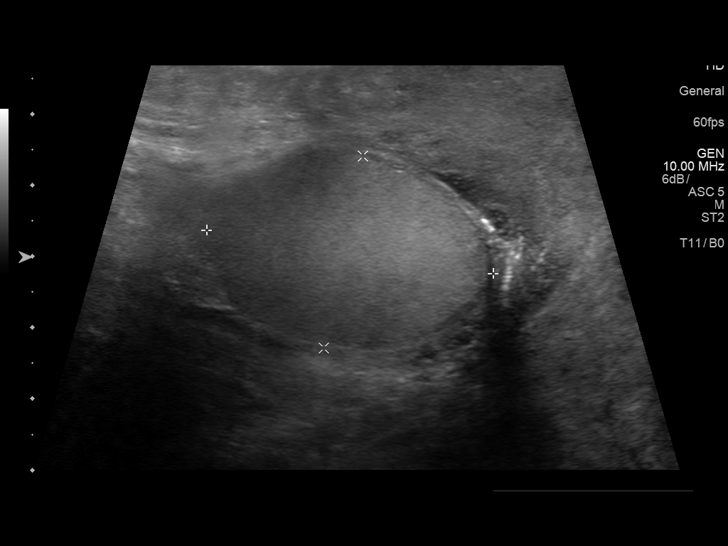
[im 5/29]
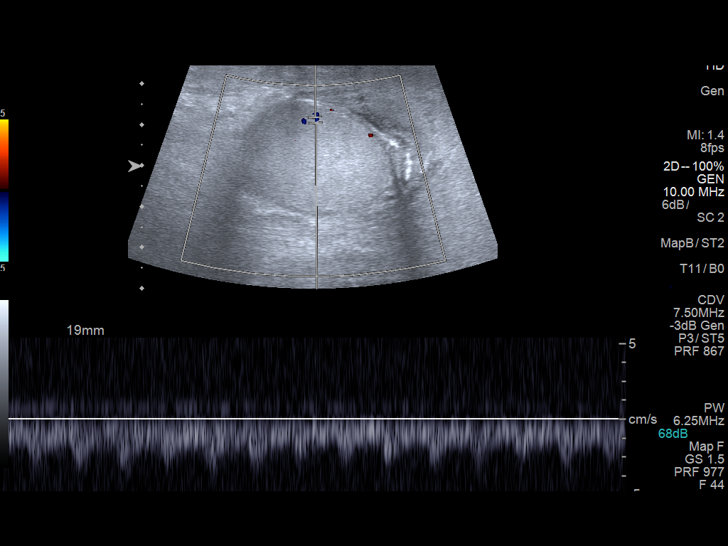
[im 8/29]
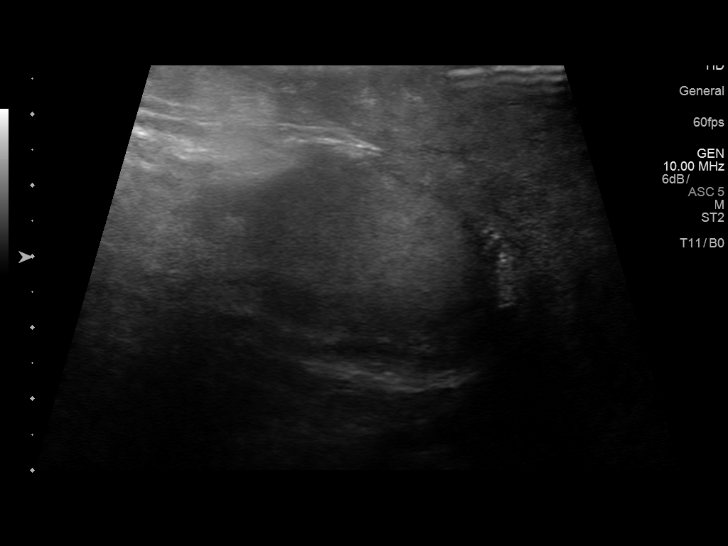
[im 10/29]
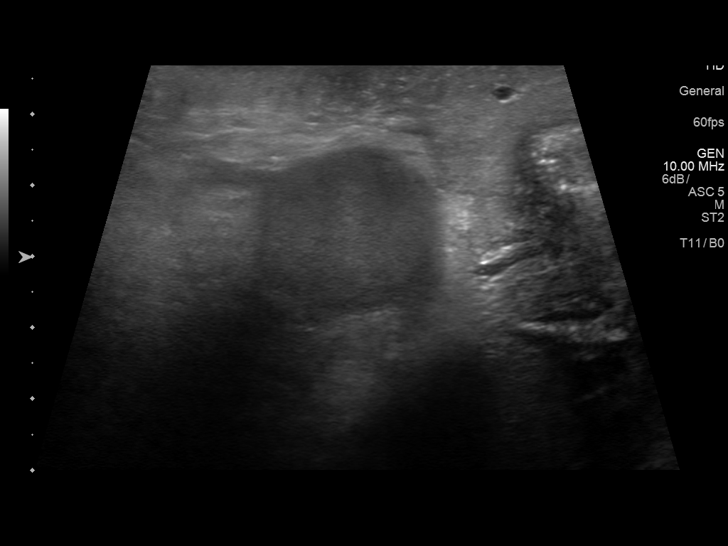
[im 12/29]
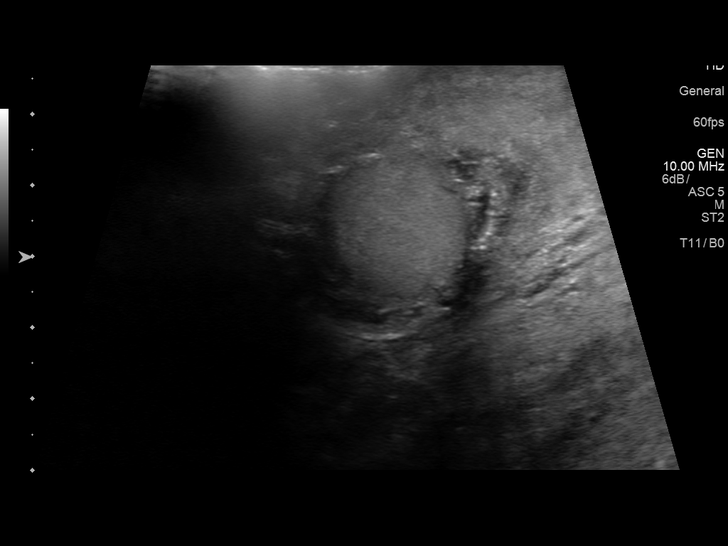
[im 15/29]
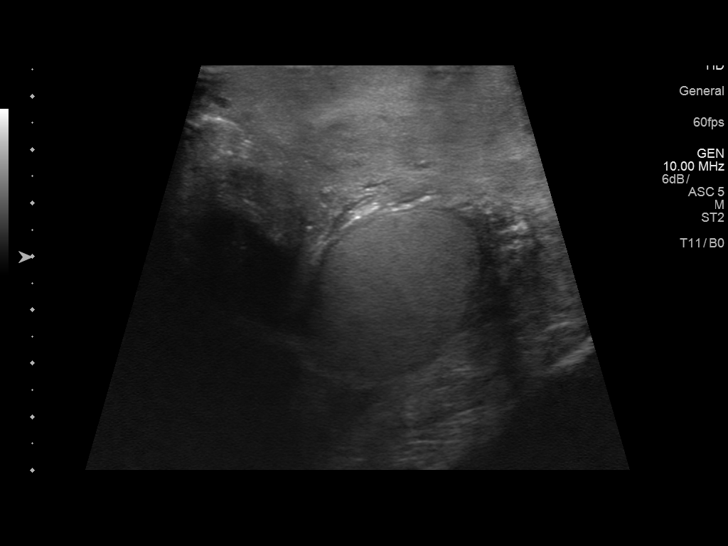
[im 17/29]
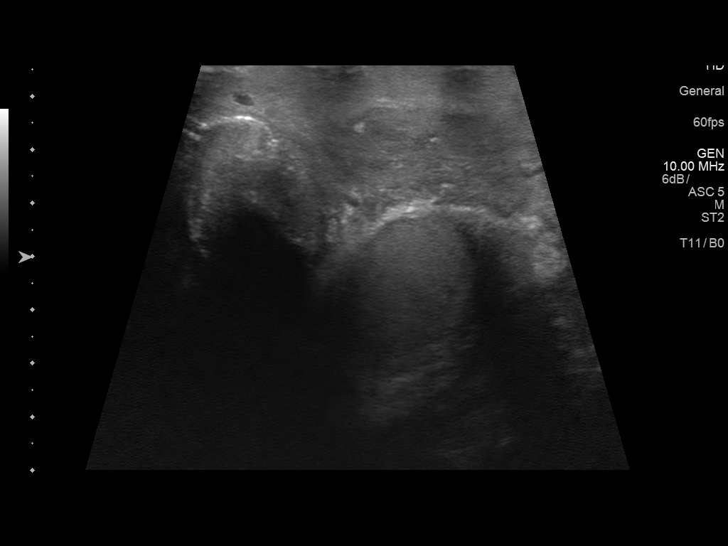
[im 19/29]
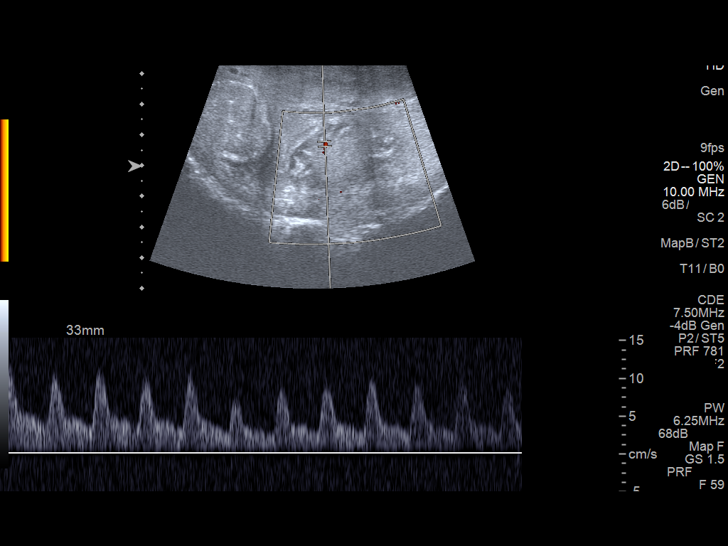
[im 22/29]
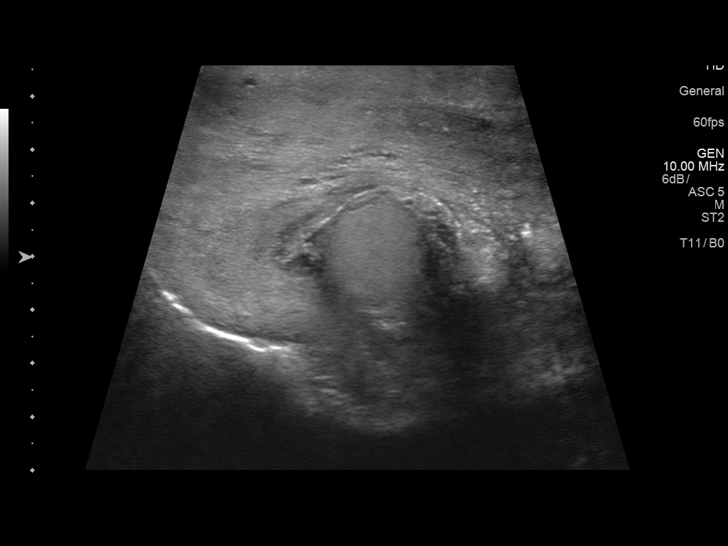
[im 24/29]
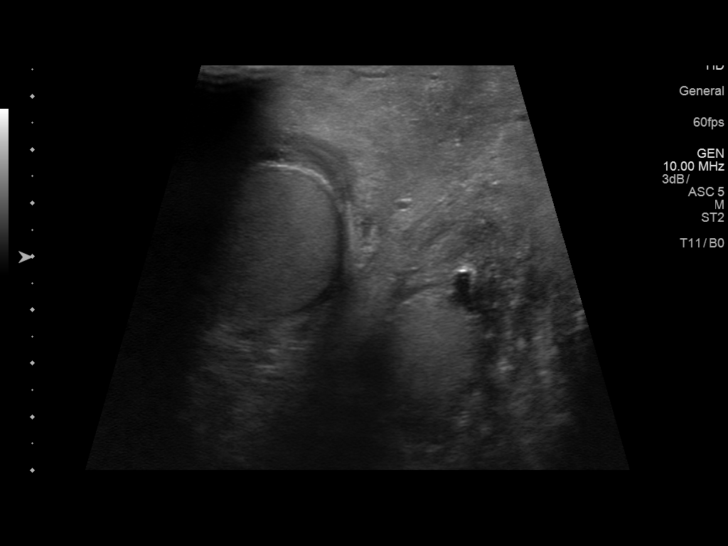
[im 26/29]
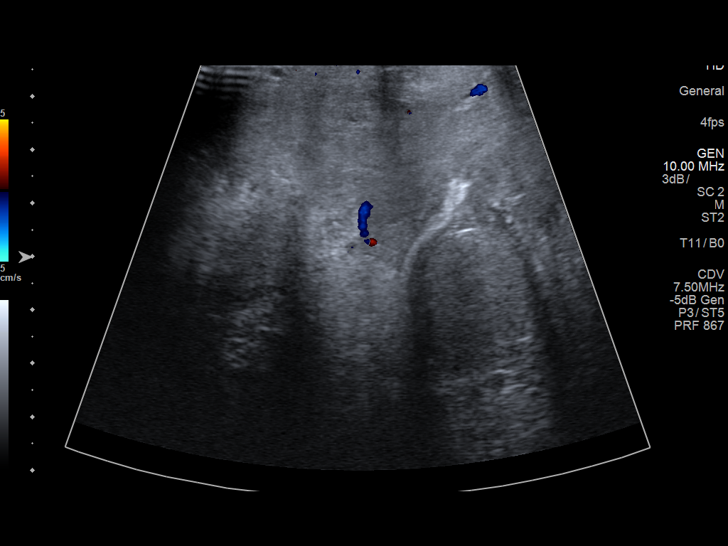
[im 29/29]
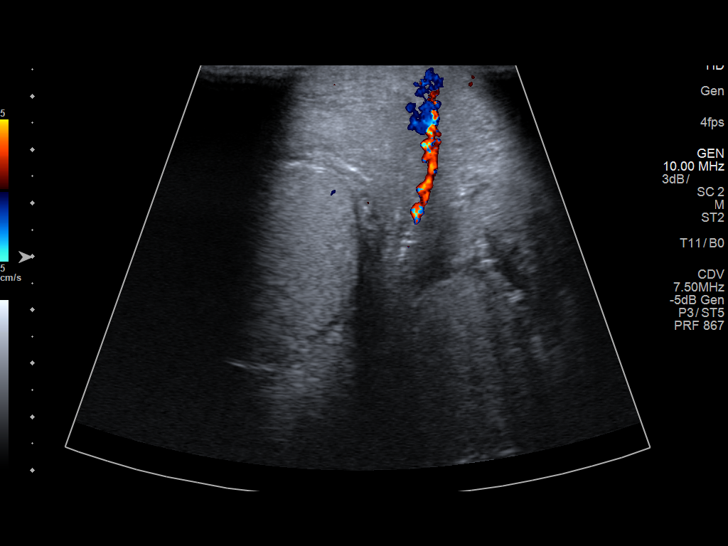

[13 of 25 positions shown; findings below may reference images not displayed]

FINDINGS: Right testicle

Measurements: 41 x 28 x 29 mm. No mass or microlithiasis visualized.

Left testicle

Measurements: 38 x 28 x 28 mm. No mass or microlithiasis visualized.

Right epididymis: Not able to be visualized. No indication of
enlargement or hypervascularity.

Left epididymis: Not able to be visualized. No indication of
enlargement or hypervascularity.

Hydrocele:  None visualized.

Varicocele:  None visualized.

Pulsed Doppler interrogation of both testes demonstrates normal low
resistance arterial waveforms bilaterally. Detection and venous
waveforms was more difficult, likely hindered by skin thickening.

Improved but still marked thickening of the scrotal wall diffusely,
without evidence of collection or gas.
IMPRESSION: 1. Scrotal wall thickening, improved from 11/26/2014 but still
marked. No fluid collection.
2. Negative testicles.
